# Patient Record
Sex: Female | Born: 1980 | Race: White | Hispanic: No | Marital: Married | Smoking: Never smoker
Health system: Southern US, Community
[De-identification: ages and names within clinical notes are randomized; demographics above are authoritative.]

## PROBLEM LIST (undated history)

## (undated) DIAGNOSIS — K219 Gastro-esophageal reflux disease without esophagitis: Secondary | ICD-10-CM

## (undated) DIAGNOSIS — Z8619 Personal history of other infectious and parasitic diseases: Secondary | ICD-10-CM

## (undated) DIAGNOSIS — K589 Irritable bowel syndrome without diarrhea: Secondary | ICD-10-CM

## (undated) HISTORY — DX: Personal history of other infectious and parasitic diseases: Z86.19

## (undated) HISTORY — DX: Irritable bowel syndrome, unspecified: K58.9

## (undated) HISTORY — DX: Gastro-esophageal reflux disease without esophagitis: K21.9

---
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
## 2002-01-02 ENCOUNTER — Encounter: Payer: Self-pay | Admitting: Internal Medicine

## 2002-01-02 ENCOUNTER — Encounter: Admission: RE | Admit: 2002-01-02 | Discharge: 2002-01-02 | Payer: Self-pay | Admitting: Internal Medicine

## 2002-07-11 ENCOUNTER — Encounter: Payer: Self-pay | Admitting: Internal Medicine

## 2002-07-11 ENCOUNTER — Encounter: Admission: RE | Admit: 2002-07-11 | Discharge: 2002-07-11 | Payer: Self-pay | Admitting: Internal Medicine

## 2002-10-01 ENCOUNTER — Other Ambulatory Visit: Admission: RE | Admit: 2002-10-01 | Discharge: 2002-10-01 | Payer: Self-pay | Admitting: Internal Medicine

## 2003-01-13 ENCOUNTER — Encounter: Admission: RE | Admit: 2003-01-13 | Discharge: 2003-01-13 | Payer: Self-pay | Admitting: Internal Medicine

## 2003-10-27 ENCOUNTER — Other Ambulatory Visit: Admission: RE | Admit: 2003-10-27 | Discharge: 2003-10-27 | Payer: Self-pay | Admitting: Family Medicine

## 2004-07-15 ENCOUNTER — Encounter: Admission: RE | Admit: 2004-07-15 | Discharge: 2004-07-15 | Payer: Self-pay | Admitting: Internal Medicine

## 2004-11-11 ENCOUNTER — Other Ambulatory Visit: Admission: RE | Admit: 2004-11-11 | Discharge: 2004-11-11 | Payer: Self-pay | Admitting: Internal Medicine

## 2005-06-21 ENCOUNTER — Other Ambulatory Visit: Admission: RE | Admit: 2005-06-21 | Discharge: 2005-06-21 | Payer: Self-pay | Admitting: Obstetrics and Gynecology

## 2006-01-03 ENCOUNTER — Other Ambulatory Visit: Admission: RE | Admit: 2006-01-03 | Discharge: 2006-01-03 | Payer: Self-pay | Admitting: Obstetrics and Gynecology

## 2007-01-04 ENCOUNTER — Other Ambulatory Visit: Admission: RE | Admit: 2007-01-04 | Discharge: 2007-01-04 | Payer: Self-pay | Admitting: Family Medicine

## 2007-07-04 ENCOUNTER — Other Ambulatory Visit: Admission: RE | Admit: 2007-07-04 | Discharge: 2007-07-04 | Payer: Self-pay | Admitting: Family Medicine

## 2008-01-08 ENCOUNTER — Other Ambulatory Visit: Admission: RE | Admit: 2008-01-08 | Discharge: 2008-01-08 | Payer: Self-pay | Admitting: Family Medicine

## 2008-05-23 ENCOUNTER — Encounter: Admission: RE | Admit: 2008-05-23 | Discharge: 2008-05-23 | Payer: Self-pay | Admitting: Family Medicine

## 2009-01-07 ENCOUNTER — Other Ambulatory Visit: Admission: RE | Admit: 2009-01-07 | Discharge: 2009-01-07 | Payer: Self-pay | Admitting: Family Medicine

## 2009-05-26 ENCOUNTER — Encounter: Admission: RE | Admit: 2009-05-26 | Discharge: 2009-05-26 | Payer: Self-pay | Admitting: Orthopedic Surgery

## 2009-10-29 ENCOUNTER — Encounter: Admission: RE | Admit: 2009-10-29 | Discharge: 2009-10-29 | Payer: Self-pay | Admitting: Family Medicine

## 2009-10-30 ENCOUNTER — Encounter: Admission: RE | Admit: 2009-10-30 | Discharge: 2009-10-30 | Payer: Self-pay | Admitting: Family Medicine

## 2010-01-12 ENCOUNTER — Other Ambulatory Visit: Admission: RE | Admit: 2010-01-12 | Discharge: 2010-01-12 | Payer: Self-pay | Admitting: Family Medicine

## 2010-07-16 NOTE — Op Note (Signed)
   NAME:  Taylor Payne, BRINKMAN                       ACCOUNT NO.:  [id number]   MEDICAL RECORD NO.:  [id number]                   PATIENT TYPE:  AMB   LOCATION:  ENDO                                 FACILITY:  [facility]   PHYSICIAN:  Danise Edge, M.D.                DATE OF BIRTH:  11/11/1980   DATE OF PROCEDURE:  12/14/2001  DATE OF DISCHARGE:  12/14/2001                                 OPERATIVE REPORT   PROCEDURE:  Flexible proctosigmoidoscopy.   INDICATIONS:  The patient is a 30 year old female born 1980/09/05.  The patient  has meal-stimulated abdominal pain, bloating, and nonbloody diarrhea  consistent with irritable bowel syndrome.  I have given her samples of Nu-  Lev 0.125 mg sublingual tablets to be used p.r.n.   DESCRIPTION OF PROCEDURE:  The patient was given a Fleet's enema prep.  She  did not require conscious sedation.  Anal inspection was normal.  Digital  rectal exam was normal.  Flexible proctocolonoscopy was carried out to 45 cm  using the Olympus pediatric colonoscope.  Endoscopic appearance of the  rectum and colon with the pediatric colonoscope advanced to 45 cm from the  anal verge was normal.  Endoscopically there was no evidence for the  presence of inflammatory bowel disease or colonic pathology.                                               Danise Edge, M.D.    MJ/MEDQ  D:  12/14/2001  T:  12/16/2001  Job:  [id number]   cc:   Marcene Duos, M.D.  [address]  [place]  [place] [address]  Fax: [phone]

## 2011-01-14 ENCOUNTER — Other Ambulatory Visit: Payer: Self-pay | Admitting: Physician Assistant

## 2011-01-14 ENCOUNTER — Other Ambulatory Visit (HOSPITAL_COMMUNITY)
Admission: RE | Admit: 2011-01-14 | Discharge: 2011-01-14 | Disposition: A | Discharge status: Home / Self Care | Payer: Self-pay | Source: Ambulatory Visit | Attending: Family Medicine | Admitting: Family Medicine

## 2011-01-14 DIAGNOSIS — Z124 Encounter for screening for malignant neoplasm of cervix: Secondary | ICD-10-CM | POA: Insufficient documentation

## 2011-11-12 ENCOUNTER — Emergency Department (INDEPENDENT_AMBULATORY_CARE_PROVIDER_SITE_OTHER)
Admission: EM | Admit: 2011-11-12 | Discharge: 2011-11-12 | Disposition: A | Discharge status: Nursing Home (Includes ICF) | Payer: PRIVATE HEALTH INSURANCE | Source: Home / Self Care | Attending: Family Medicine | Admitting: Family Medicine

## 2011-11-12 ENCOUNTER — Encounter (HOSPITAL_COMMUNITY): Payer: Self-pay | Admitting: *Deleted

## 2011-11-12 ENCOUNTER — Emergency Department (HOSPITAL_COMMUNITY)
Admission: EM | Admit: 2011-11-12 | Discharge: 2011-11-12 | Disposition: A | Discharge status: Home / Self Care | Payer: PRIVATE HEALTH INSURANCE | Attending: Emergency Medicine | Admitting: Emergency Medicine

## 2011-11-12 DIAGNOSIS — R109 Unspecified abdominal pain: Secondary | ICD-10-CM

## 2011-11-12 DIAGNOSIS — Z79899 Other long term (current) drug therapy: Secondary | ICD-10-CM | POA: Insufficient documentation

## 2011-11-12 LAB — CBC
HCT: 42.6 % (ref 36.0–46.0)
Hemoglobin: 14.4 g/dL (ref 12.0–15.0)
MCH: 26.5 pg (ref 26.0–34.0)
MCHC: 33.8 g/dL (ref 30.0–36.0)
MCV: 78.5 fL (ref 78.0–100.0)
Platelets: 305 10*3/uL (ref 150–400)
RBC: 5.43 MIL/uL — ABNORMAL HIGH (ref 3.87–5.11)
RDW: 13.6 % (ref 11.5–15.5)
WBC: 10.1 10*3/uL (ref 4.0–10.5)

## 2011-11-12 LAB — POCT I-STAT, CHEM 8
BUN: 8 mg/dL (ref 6–23)
Calcium, Ion: 1.16 mmol/L (ref 1.12–1.23)
Chloride: 105 mEq/L (ref 96–112)
Creatinine, Ser: 0.8 mg/dL (ref 0.50–1.10)
Glucose, Bld: 90 mg/dL (ref 70–99)
HCT: 45 % (ref 36.0–46.0)
Hemoglobin: 15.3 g/dL — ABNORMAL HIGH (ref 12.0–15.0)
Potassium: 3.9 mEq/L (ref 3.5–5.1)
Sodium: 140 mEq/L (ref 135–145)
TCO2: 23 mmol/L (ref 0–100)

## 2011-11-12 LAB — POCT URINALYSIS DIP (DEVICE)
Hgb urine dipstick: NEGATIVE
Protein, ur: NEGATIVE mg/dL
pH: 6.5 (ref 5.0–8.0)

## 2011-11-12 LAB — POCT PREGNANCY, URINE: Preg Test, Ur: NEGATIVE

## 2011-11-12 MED ORDER — NAPROXEN 500 MG PO TABS: 500.0000 mg | ORAL_TABLET | Freq: Two times a day (BID) | ORAL | Status: AC

## 2011-11-12 MED ORDER — TAMSULOSIN HCL 0.4 MG PO CAPS: 0.4000 mg | ORAL_CAPSULE | Freq: Two times a day (BID) | ORAL | Status: DC

## 2011-11-12 MED ORDER — HYDROCODONE-ACETAMINOPHEN 5-500 MG PO TABS
1.0000 | ORAL_TABLET | Freq: Four times a day (QID) | ORAL | Status: AC | PRN
Start: 2011-11-12 — End: 2011-11-22

## 2011-11-12 MED ORDER — PROMETHAZINE HCL 25 MG PO TABS: 25.0000 mg | ORAL_TABLET | Freq: Four times a day (QID) | ORAL | Status: DC | PRN

## 2011-11-12 NOTE — ED Notes (Signed)
Pt  Reports  Symptoms  Of r  Sided  Low  Back  Pain radiating to   r lower  abd    X  2  Weeks  Foul  Smelling  And  Frequent  Urine         She  Walks  Upright with a  Steady  Fluid  Gait  denys  Any  Vaginal bleed or discharge

## 2011-11-12 NOTE — ED Provider Notes (Signed)
History     CSN: [id number]  Arrival date & time 11/12/11  2021   First MD Initiated Contact with Patient 11/12/11 2321      Chief Complaint  Patient presents with  . Abdominal Pain    (Consider location/radiation/quality/duration/timing/severity/associated sxs/prior treatment) HPI Comments: 31 year old female with a history of right flank pain that has been ongoing for approximately 2 weeks, waxes and wanes, it is often pain-free has intermittent pain that comes on spontaneously. She denies associated hematuria, dysuria, nausea vomiting diaphoresis or abdominal pain. Nothing seems to make this better, has had no medications prior to arrival though she did this at the urgent care who transferred her here for further evaluation. The patient states that she does not want any evaluation in the emergency department, she feels that she can handle her symptoms at home and wants outpatient referral.  Patient is a 31 y.o. female presenting with abdominal pain. The history is provided by the patient and medical records.  Abdominal Pain The primary symptoms of the illness include abdominal pain.    History reviewed. No pertinent past medical history.  History reviewed. No pertinent past surgical history.  History reviewed. No pertinent family history.  History  Substance Use Topics  . Smoking status: Never Smoker   . Smokeless tobacco: Not on file  . Alcohol Use: Yes    OB History    Grav Para Term Preterm Abortions TAB SAB Ect Mult Living                  Review of Systems  Gastrointestinal: Positive for abdominal pain.  All other systems reviewed and are negative.    Allergies  Penicillins and Ocuflox  Home Medications   Current Outpatient Rx  Name Route Sig Dispense Refill  . DEXLANSOPRAZOLE 60 MG PO CPDR Oral Take 60 mg by mouth every evening.    Marland Kitchen LEVOCETIRIZINE DIHYDROCHLORIDE 5 MG PO TABS Oral Take 2.5 mg by mouth every evening.    Marland Kitchen XYZAL PO Oral Take 0.5 tablets  by mouth every evening.    . SKELAXIN PO Oral Take 0.5 tablets by mouth daily as needed. For muscle spasms    . MOMETASONE FUROATE 50 MCG/ACT NA SUSP Nasal Place 2 sprays into the nose daily.    Marland Kitchen RANITIDINE HCL 300 MG PO CAPS Oral Take 300 mg by mouth every evening.    Marland Kitchen HYDROCODONE-ACETAMINOPHEN 5-500 MG PO TABS Oral Take 1-2 tablets by mouth every 6 (six) hours as needed for pain. 15 tablet 0  . NAPROXEN 500 MG PO TABS Oral Take 1 tablet (500 mg total) by mouth 2 (two) times daily with a meal. 30 tablet 0  . NORETHIN ACE-ETH ESTRAD-FE 1-20 MG-MCG PO TABS Oral Take 1 tablet by mouth daily.    Marland Kitchen PROMETHAZINE HCL 25 MG PO TABS Oral Take 1 tablet (25 mg total) by mouth every 6 (six) hours as needed for nausea. 12 tablet 0  . TAMSULOSIN HCL 0.4 MG PO CAPS Oral Take 1 capsule (0.4 mg total) by mouth 2 (two) times daily. 10 capsule 0    BP 121/90  Pulse 86  Temp 98.4 F (36.9 C) (Oral)  Resp 16  SpO2 100%  LMP 10/12/2011  Physical Exam  Nursing note and vitals reviewed. Constitutional: She appears well-developed and well-nourished. No distress.  HENT:  Head: Normocephalic and atraumatic.  Eyes: Conjunctivae normal are normal. Right eye exhibits no discharge. Left eye exhibits no discharge. No scleral icterus.  Cardiovascular: Normal rate, regular  rhythm, normal heart sounds and intact distal pulses.  Exam reveals no gallop and no friction rub.   No murmur heard. Pulmonary/Chest: Effort normal and breath sounds normal. No respiratory distress. She has no wheezes. She has no rales.  Abdominal: Soft. Bowel sounds are normal. She exhibits no distension and no mass. There is no tenderness.       No CVA tenderness  Musculoskeletal: Normal range of motion. She exhibits no edema and no tenderness.  Neurological: She is alert. Coordination normal.  Skin: Skin is warm and dry. No rash noted. No erythema.  Psychiatric: She has a normal mood and affect. Her behavior is normal.    ED Course    Procedures (including critical care time)  Labs Reviewed  CBC - Abnormal; Notable for the following:    RBC 5.43 (*)     All other components within normal limits  POCT I-STAT, CHEM 8 - Abnormal; Notable for the following:    Hemoglobin 15.3 (*)     All other components within normal limits   No results found.   1. Right flank pain       MDM  Well appearing, vital signs are normal, patient does not want any further evaluation here but accepts medical treatment of a possible kidney stone, will discharge home with urology contact information, Phenergan, Vicodin, Naprosyn, Flomax. He has expressed her understanding for the indications for return.        Vida Roller, MD 11/12/11 [phone]

## 2011-11-12 NOTE — ED Notes (Signed)
Patient with c/o right lower flank abdominal pain for about two weeks.  Patient thinks that it is kidney stone.  Patient sent from [facility] for further evaluation.

## 2011-11-12 NOTE — ED Provider Notes (Signed)
History     CSN: [id number]  Arrival date & time 11/12/11  1848   None     Chief Complaint  Patient presents with  . Urinary Tract Infection    (Consider location/radiation/quality/duration/timing/severity/associated sxs/prior treatment) Patient is a 31 y.o. female presenting with urinary tract infection. The history is provided by the patient.  Urinary Tract Infection Associated symptoms include abdominal pain.  Patient reports intermittent right flank pain that radiating down right leg for one week, states in the past couple of days unable to sleep related to pain and has began to feel pain in groin along with abdominal pressure. Noted urinary hesistency and retention with urinary frequency.  Reports flank pain is aggravated with positional, no noted fever, decreased appetite, + fatigue.  Denies hx of same, no urinary or musculoskeletal disorders.  No abnormal vaginal discharge or bleeding.  Denies known exposure to STD.   History reviewed. No pertinent past medical history.  History reviewed. No pertinent past surgical history.  No family history on file.  History  Substance Use Topics  . Smoking status: Never Smoker   . Smokeless tobacco: Not on file  . Alcohol Use: Yes    OB History    Grav Para Term Preterm Abortions TAB SAB Ect Mult Living                  Review of Systems  Constitutional: Positive for appetite change and fatigue. Negative for fever, chills and diaphoresis.  Respiratory: Negative.   Cardiovascular: Negative.   Gastrointestinal: Positive for abdominal pain. Negative for nausea, vomiting and diarrhea.  Genitourinary: Positive for urgency, frequency, flank pain and pelvic pain. Negative for dysuria, hematuria, decreased urine volume, vaginal bleeding, vaginal discharge, difficulty urinating and menstrual problem.  Skin: Negative.     Allergies  Acuflex and Penicillins  Home Medications  No current outpatient prescriptions on file.  BP 116/79   Pulse 82  Temp 99.3 F (37.4 C) (Oral)  Resp 18  SpO2 97%  LMP 10/12/2011  Physical Exam  Nursing note and vitals reviewed. Constitutional: She is oriented to person, place, and time. Vital signs are normal. She appears well-developed and well-nourished. She is active and cooperative.  HENT:  Head: Normocephalic.  Mouth/Throat: Oropharynx is clear and moist. No oropharyngeal exudate.  Eyes: Conjunctivae normal are normal. Pupils are equal, round, and reactive to light. No scleral icterus.  Neck: Trachea normal. Neck supple.  Cardiovascular: Normal rate, regular rhythm and normal heart sounds.   Pulmonary/Chest: Effort normal and breath sounds normal. She exhibits no tenderness.  Abdominal: Soft. Bowel sounds are normal. There is tenderness in the right lower quadrant and suprapubic area. There is CVA tenderness.  Musculoskeletal:       Back:       Right cva tenderness  Lymphadenopathy:    She has no cervical adenopathy.       Right: No inguinal adenopathy present.       Left: No inguinal adenopathy present.  Neurological: She is alert and oriented to person, place, and time. She has normal strength. No cranial nerve deficit or sensory deficit. GCS eye subscore is 4. GCS verbal subscore is 5. GCS motor subscore is 6.  Skin: Skin is warm and dry.  Psychiatric: She has a normal mood and affect. Her speech is normal and behavior is normal. Judgment and thought content normal. Cognition and memory are normal.    ED Course  Procedures (including critical care time)  Labs Reviewed  POCT URINALYSIS DIP (  DEVICE) - Abnormal; Notable for the following:    Ketones, ur TRACE (*)     All other components within normal limits  POCT PREGNANCY, URINE   No results found.   1. Right flank pain       MDM  Transfer to [facility] for further evaluation of flank pain and treatment.       Johnsie Kindred, NP 11/12/11 2028

## 2011-11-13 NOTE — ED Provider Notes (Signed)
Medical screening examination/treatment/procedure(s) were performed by resident physician or non-physician practitioner and as supervising physician I was immediately available for consultation/collaboration.   Janisa Labus DOUGLAS MD.    Len Azeez D Jeniyah Menor, MD 11/13/11 0921 

## 2011-12-01 ENCOUNTER — Other Ambulatory Visit: Payer: Self-pay | Admitting: Dermatology

## 2012-01-19 ENCOUNTER — Other Ambulatory Visit: Payer: Self-pay | Admitting: Physician Assistant

## 2012-01-19 ENCOUNTER — Other Ambulatory Visit (HOSPITAL_COMMUNITY)
Admission: RE | Admit: 2012-01-19 | Discharge: 2012-01-19 | Disposition: A | Discharge status: Home / Self Care | Payer: PRIVATE HEALTH INSURANCE | Source: Ambulatory Visit | Attending: Family Medicine | Admitting: Family Medicine

## 2012-01-19 DIAGNOSIS — Z124 Encounter for screening for malignant neoplasm of cervix: Secondary | ICD-10-CM | POA: Insufficient documentation

## 2012-12-03 ENCOUNTER — Other Ambulatory Visit: Payer: Self-pay | Admitting: Dermatology

## 2013-01-21 ENCOUNTER — Other Ambulatory Visit (HOSPITAL_COMMUNITY)
Admission: RE | Admit: 2013-01-21 | Discharge: 2013-01-21 | Disposition: A | Discharge status: Home / Self Care | Payer: PRIVATE HEALTH INSURANCE | Source: Ambulatory Visit | Attending: Family Medicine | Admitting: Family Medicine

## 2013-01-21 ENCOUNTER — Other Ambulatory Visit: Payer: Self-pay | Admitting: Physician Assistant

## 2013-01-21 DIAGNOSIS — N6310 Unspecified lump in the right breast, unspecified quadrant: Secondary | ICD-10-CM

## 2013-01-21 DIAGNOSIS — Z124 Encounter for screening for malignant neoplasm of cervix: Secondary | ICD-10-CM | POA: Insufficient documentation

## 2013-01-28 ENCOUNTER — Ambulatory Visit
Admission: RE | Admit: 2013-01-28 | Discharge: 2013-01-28 | Disposition: A | Discharge status: Home / Self Care | Payer: PRIVATE HEALTH INSURANCE | Source: Ambulatory Visit | Attending: Physician Assistant | Admitting: Physician Assistant

## 2013-01-28 DIAGNOSIS — N6310 Unspecified lump in the right breast, unspecified quadrant: Secondary | ICD-10-CM

## 2014-02-14 LAB — OB RESULTS CONSOLE RPR: RPR: NONREACTIVE

## 2014-02-14 LAB — OB RESULTS CONSOLE ABO/RH: RH Type: POSITIVE

## 2014-02-14 LAB — OB RESULTS CONSOLE RUBELLA ANTIBODY, IGM: RUBELLA: IMMUNE

## 2014-02-14 LAB — OB RESULTS CONSOLE HEPATITIS B SURFACE ANTIGEN: Hepatitis B Surface Ag: NEGATIVE

## 2014-02-14 LAB — OB RESULTS CONSOLE ANTIBODY SCREEN: ANTIBODY SCREEN: NEGATIVE

## 2014-02-14 LAB — OB RESULTS CONSOLE GC/CHLAMYDIA
Chlamydia: NEGATIVE
Gonorrhea: NEGATIVE

## 2014-02-14 LAB — OB RESULTS CONSOLE HIV ANTIBODY (ROUTINE TESTING): HIV: NONREACTIVE

## 2014-02-28 NOTE — L&D Delivery Note (Signed)
Delivery Note At 4:58 PM a viable female was delivered via Vaginal, Spontaneous Delivery (Presentation: Left Occiput Anterior).  APGAR: 6, 9; weight pending.   Placenta status: Intact, Spontaneous.  Cord: 3 vessels with the following complications: None.  Anesthesia: Epidural  Episiotomy:  None Lacerations: 2nd degree Suture Repair: 3.0 vicryl rapide Est. Blood Loss (mL): 200  Mom to postpartum.  Baby to Couplet care / Skin to Skin.  Discussed circumcision procedure and risks, will proceed tomorrow.  Benancio Osmundson D 09/18/2014, 5:23 PM

## 2014-03-03 ENCOUNTER — Inpatient Hospital Stay (HOSPITAL_COMMUNITY)
Admission: AD | Admit: 2014-03-03 | Payer: PRIVATE HEALTH INSURANCE | Source: Ambulatory Visit | Admitting: Obstetrics and Gynecology

## 2014-08-13 LAB — OB RESULTS CONSOLE GBS: GBS: NEGATIVE

## 2014-09-11 ENCOUNTER — Telehealth (HOSPITAL_COMMUNITY): Payer: Self-pay | Admitting: *Deleted

## 2014-09-11 ENCOUNTER — Encounter (HOSPITAL_COMMUNITY): Payer: Self-pay | Admitting: *Deleted

## 2014-09-11 NOTE — Telephone Encounter (Signed)
Preadmission screen  

## 2014-09-14 ENCOUNTER — Inpatient Hospital Stay (HOSPITAL_COMMUNITY)
Admission: AD | Admit: 2014-09-14 | Payer: PRIVATE HEALTH INSURANCE | Source: Ambulatory Visit | Admitting: Obstetrics and Gynecology

## 2014-09-18 ENCOUNTER — Inpatient Hospital Stay (HOSPITAL_COMMUNITY)
Admission: RE | Admit: 2014-09-18 | Discharge: 2014-09-20 | DRG: 775 | Disposition: A | Discharge status: Home / Self Care | Payer: Managed Care, Other (non HMO) | Source: Ambulatory Visit | Attending: Obstetrics and Gynecology | Admitting: Obstetrics and Gynecology

## 2014-09-18 ENCOUNTER — Inpatient Hospital Stay (HOSPITAL_COMMUNITY): Payer: Managed Care, Other (non HMO) | Admitting: Anesthesiology

## 2014-09-18 ENCOUNTER — Encounter (HOSPITAL_COMMUNITY): Payer: Self-pay

## 2014-09-18 DIAGNOSIS — Z3400 Encounter for supervision of normal first pregnancy, unspecified trimester: Secondary | ICD-10-CM

## 2014-09-18 DIAGNOSIS — K219 Gastro-esophageal reflux disease without esophagitis: Secondary | ICD-10-CM | POA: Diagnosis present

## 2014-09-18 DIAGNOSIS — Z3A40 40 weeks gestation of pregnancy: Secondary | ICD-10-CM | POA: Diagnosis present

## 2014-09-18 DIAGNOSIS — K589 Irritable bowel syndrome without diarrhea: Secondary | ICD-10-CM | POA: Diagnosis present

## 2014-09-18 DIAGNOSIS — O9962 Diseases of the digestive system complicating childbirth: Secondary | ICD-10-CM | POA: Diagnosis present

## 2014-09-18 LAB — CBC
HCT: 36.5 % (ref 36.0–46.0)
Hemoglobin: 12 g/dL (ref 12.0–15.0)
MCH: 27.1 pg (ref 26.0–34.0)
MCHC: 32.9 g/dL (ref 30.0–36.0)
MCV: 82.6 fL (ref 78.0–100.0)
Platelets: 143 10*3/uL — ABNORMAL LOW (ref 150–400)
RBC: 4.42 MIL/uL (ref 3.87–5.11)
RDW: 15.6 % — ABNORMAL HIGH (ref 11.5–15.5)
WBC: 10.6 10*3/uL — ABNORMAL HIGH (ref 4.0–10.5)

## 2014-09-18 LAB — RPR: RPR Ser Ql: NONREACTIVE

## 2014-09-18 MED ORDER — IBUPROFEN 600 MG PO TABS
600.0000 mg | ORAL_TABLET | Freq: Four times a day (QID) | ORAL | Status: DC
  Administered 2014-09-18 – 2014-09-20 (×6): 600 mg via ORAL
  Filled 2014-09-18 (×6): qty 1

## 2014-09-18 MED ORDER — LEVOCETIRIZINE DIHYDROCHLORIDE 5 MG PO TABS: 2.5000 mg | ORAL_TABLET | Freq: Every evening | ORAL | Status: DC

## 2014-09-18 MED ORDER — ACETAMINOPHEN 325 MG PO TABS: 650.0000 mg | ORAL_TABLET | ORAL | Status: DC | PRN

## 2014-09-18 MED ORDER — SENNOSIDES-DOCUSATE SODIUM 8.6-50 MG PO TABS
2.0000 | ORAL_TABLET | ORAL | Status: DC
  Administered 2014-09-19 (×2): 2 via ORAL
  Filled 2014-09-18 (×2): qty 2

## 2014-09-18 MED ORDER — LORATADINE 10 MG PO TABS
10.0000 mg | ORAL_TABLET | Freq: Every evening | ORAL | Status: DC
  Administered 2014-09-18 – 2014-09-19 (×2): 10 mg via ORAL
  Filled 2014-09-18 (×2): qty 1

## 2014-09-18 MED ORDER — CITRIC ACID-SODIUM CITRATE 334-500 MG/5ML PO SOLN: 30.0000 mL | ORAL | Status: DC | PRN

## 2014-09-18 MED ORDER — FENTANYL 2.5 MCG/ML BUPIVACAINE 1/10 % EPIDURAL INFUSION (WH - ANES)
14.0000 mL/h | INTRAMUSCULAR | Status: DC | PRN
  Administered 2014-09-18 (×2): 14 mL/h via EPIDURAL
  Filled 2014-09-18: qty 125

## 2014-09-18 MED ORDER — SIMETHICONE 80 MG PO CHEW: 80.0000 mg | CHEWABLE_TABLET | ORAL | Status: DC | PRN

## 2014-09-18 MED ORDER — METHYLERGONOVINE MALEATE 0.2 MG/ML IJ SOLN: 0.2000 mg | INTRAMUSCULAR | Status: DC | PRN

## 2014-09-18 MED ORDER — OXYCODONE-ACETAMINOPHEN 5-325 MG PO TABS: 2.0000 | ORAL_TABLET | ORAL | Status: DC | PRN

## 2014-09-18 MED ORDER — PRENATAL MULTIVITAMIN CH
1.0000 | ORAL_TABLET | Freq: Every day | ORAL | Status: DC
  Administered 2014-09-19: 1 via ORAL
  Filled 2014-09-18: qty 1

## 2014-09-18 MED ORDER — ONDANSETRON HCL 4 MG/2ML IJ SOLN
4.0000 mg | INTRAMUSCULAR | Status: DC | PRN
Start: 2014-09-18 — End: 2014-09-20

## 2014-09-18 MED ORDER — LACTATED RINGERS IV SOLN
500.0000 mL | INTRAVENOUS | Status: DC | PRN
Start: 2014-09-18 — End: 2014-09-18
  Administered 2014-09-18: 500 mL via INTRAVENOUS

## 2014-09-18 MED ORDER — OXYCODONE-ACETAMINOPHEN 5-325 MG PO TABS: 1.0000 | ORAL_TABLET | ORAL | Status: DC | PRN

## 2014-09-18 MED ORDER — OXYTOCIN 40 UNITS IN LACTATED RINGERS INFUSION - SIMPLE MED: 62.5000 mL/h | INTRAVENOUS | Status: DC

## 2014-09-18 MED ORDER — BENZOCAINE-MENTHOL 20-0.5 % EX AERO
1.0000 "application " | INHALATION_SPRAY | CUTANEOUS | Status: DC | PRN
  Administered 2014-09-18 – 2014-09-20 (×3): 1 via TOPICAL
  Filled 2014-09-18 (×3): qty 56

## 2014-09-18 MED ORDER — OXYTOCIN 40 UNITS IN LACTATED RINGERS INFUSION - SIMPLE MED
1.0000 m[IU]/min | INTRAVENOUS | Status: DC
  Administered 2014-09-18: 4 m[IU]/min via INTRAVENOUS
  Administered 2014-09-18: 2 m[IU]/min via INTRAVENOUS
  Filled 2014-09-18: qty 1000

## 2014-09-18 MED ORDER — TERBUTALINE SULFATE 1 MG/ML IJ SOLN
0.2500 mg | Freq: Once | INTRAMUSCULAR | Status: DC | PRN
  Filled 2014-09-18: qty 1

## 2014-09-18 MED ORDER — DIPHENHYDRAMINE HCL 25 MG PO CAPS: 25.0000 mg | ORAL_CAPSULE | Freq: Four times a day (QID) | ORAL | Status: DC | PRN

## 2014-09-18 MED ORDER — ONDANSETRON HCL 4 MG PO TABS: 4.0000 mg | ORAL_TABLET | ORAL | Status: DC | PRN

## 2014-09-18 MED ORDER — DIBUCAINE 1 % RE OINT: 1.0000 "application " | TOPICAL_OINTMENT | RECTAL | Status: DC | PRN

## 2014-09-18 MED ORDER — ZOLPIDEM TARTRATE 5 MG PO TABS: 5.0000 mg | ORAL_TABLET | Freq: Every evening | ORAL | Status: DC | PRN

## 2014-09-18 MED ORDER — OXYTOCIN BOLUS FROM INFUSION: 500.0000 mL | INTRAVENOUS | Status: DC

## 2014-09-18 MED ORDER — MEASLES, MUMPS & RUBELLA VAC ~~LOC~~ INJ: 0.5000 mL | INJECTION | Freq: Once | SUBCUTANEOUS | Status: DC

## 2014-09-18 MED ORDER — MAGNESIUM HYDROXIDE 400 MG/5ML PO SUSP: 30.0000 mL | ORAL | Status: DC | PRN

## 2014-09-18 MED ORDER — LANOLIN HYDROUS EX OINT: TOPICAL_OINTMENT | CUTANEOUS | Status: DC | PRN

## 2014-09-18 MED ORDER — TETANUS-DIPHTH-ACELL PERTUSSIS 5-2.5-18.5 LF-MCG/0.5 IM SUSP: 0.5000 mL | Freq: Once | INTRAMUSCULAR | Status: DC

## 2014-09-18 MED ORDER — LIDOCAINE HCL (PF) 1 % IJ SOLN
30.0000 mL | INTRAMUSCULAR | Status: DC | PRN
  Administered 2014-09-18: 30 mL via SUBCUTANEOUS
  Filled 2014-09-18: qty 30

## 2014-09-18 MED ORDER — DIPHENHYDRAMINE HCL 50 MG/ML IJ SOLN: 12.5000 mg | INTRAMUSCULAR | Status: DC | PRN

## 2014-09-18 MED ORDER — BUTORPHANOL TARTRATE 1 MG/ML IJ SOLN
1.0000 mg | INTRAMUSCULAR | Status: DC | PRN
  Administered 2014-09-18: 1 mg via INTRAVENOUS
  Filled 2014-09-18: qty 1

## 2014-09-18 MED ORDER — LACTATED RINGERS IV SOLN
INTRAVENOUS | Status: DC
  Administered 2014-09-18: 1000 mL via INTRAVENOUS

## 2014-09-18 MED ORDER — PHENYLEPHRINE 40 MCG/ML (10ML) SYRINGE FOR IV PUSH (FOR BLOOD PRESSURE SUPPORT)
80.0000 ug | PREFILLED_SYRINGE | INTRAVENOUS | Status: DC | PRN
  Filled 2014-09-18: qty 20
  Filled 2014-09-18: qty 2

## 2014-09-18 MED ORDER — WITCH HAZEL-GLYCERIN EX PADS: 1.0000 "application " | MEDICATED_PAD | CUTANEOUS | Status: DC | PRN

## 2014-09-18 MED ORDER — METHYLERGONOVINE MALEATE 0.2 MG PO TABS: 0.2000 mg | ORAL_TABLET | ORAL | Status: DC | PRN

## 2014-09-18 MED ORDER — FLUTICASONE PROPIONATE 50 MCG/ACT NA SUSP
1.0000 | Freq: Every day | NASAL | Status: DC
  Administered 2014-09-19 – 2014-09-20 (×2): 1 via NASAL
  Filled 2014-09-18: qty 16

## 2014-09-18 MED ORDER — LIDOCAINE HCL (PF) 1 % IJ SOLN
INTRAMUSCULAR | Status: DC | PRN
  Administered 2014-09-18: 4 mL
  Administered 2014-09-18: 2 mL
  Administered 2014-09-18: 4 mL

## 2014-09-18 MED ORDER — ONDANSETRON HCL 4 MG/2ML IJ SOLN
4.0000 mg | Freq: Four times a day (QID) | INTRAMUSCULAR | Status: DC | PRN
  Administered 2014-09-18: 4 mg via INTRAVENOUS
  Filled 2014-09-18: qty 2

## 2014-09-18 MED ORDER — EPHEDRINE 5 MG/ML INJ
10.0000 mg | INTRAVENOUS | Status: DC | PRN
  Filled 2014-09-18: qty 2

## 2014-09-18 NOTE — Anesthesia Procedure Notes (Signed)
Epidural Patient location during procedure: OB  Staffing Anesthesiologist: Joliene Salvador Performed by: anesthesiologist   Preanesthetic Checklist Completed: patient identified, site marked, surgical consent, pre-op evaluation, timeout performed, IV checked, risks and benefits discussed and monitors and equipment checked  Epidural Patient position: sitting Prep: site prepped and draped and DuraPrep Patient monitoring: continuous pulse ox and blood pressure Approach: midline Location: L3-L4 Injection technique: LOR air  Needle:  Needle type: Tuohy  Needle gauge: 17 G Needle length: 9 cm and 9 Needle insertion depth: 4 cm Catheter type: closed end flexible Catheter size: 19 Gauge Catheter at skin depth: 9 cm Test dose: negative  Assessment Events: blood not aspirated, injection not painful, no injection resistance, negative IV test and no paresthesia  Additional Notes Patient identified. Risks/Benefits/Options discussed with patient including but not limited to bleeding, infection, nerve damage, paralysis, failed block, incomplete pain control, headache, blood pressure changes, nausea, vomiting, reactions to medication both or allergic, itching and postpartum back pain. Confirmed with bedside nurse the patient's most recent platelet count. Confirmed with patient that they are not currently taking any anticoagulation, have any bleeding history or any family history of bleeding disorders. Patient expressed understanding and wished to proceed. All questions were answered. Sterile technique was used throughout the entire procedure. Please see nursing notes for vital signs. Test dose was given through epidural catheter and negative prior to continuing to dose epidural or start infusion. Warning signs of high block given to the patient including shortness of breath, tingling/numbness in hands, complete motor block, or any concerning symptoms with instructions to call for help. Patient was  given instructions on fall risk and not to get out of bed. All questions and concerns addressed with instructions to call with any issues or inadequate analgesia.

## 2014-09-18 NOTE — H&P (Signed)
Taylor Payne is a 34 y.o. female, G1P0, EGA 40+ weeks with EDC 7-17 presenting for induction.  Prenatal care uncomplicated.  Maternal Medical History:  Contractions: Frequency: irregular.    Fetal activity: Perceived fetal activity is normal.      OB History    Gravida Para Term Preterm AB TAB SAB Ectopic Multiple Living   1              Past Medical History  Diagnosis Date  . Hx of varicella   . IBS (irritable bowel syndrome)   . GERD (gastroesophageal reflux disease)    History reviewed. No pertinent past surgical history. Family History: family history is not on file. Social History:  reports that she has never smoked. She has never used smokeless tobacco. She reports that she drinks alcohol. She reports that she does not use illicit drugs.   Prenatal Transfer Tool  Maternal Diabetes: No Genetic Screening: Normal Maternal Ultrasounds/Referrals: Normal Fetal Ultrasounds or other Referrals:  None Maternal Substance Abuse:  No Significant Maternal Medications:  None Significant Maternal Lab Results:  Lab values include: Group B Strep negative Other Comments:  None  Review of Systems  Respiratory: Negative.   Cardiovascular: Negative.    AROM- clear Dilation: 2 Effacement (%): 70 Station: -2 Exam by:: Taylor Payne Blood pressure 120/89, last menstrual period 12/08/2013. Maternal Exam:  Uterine Assessment: Contraction strength is mild.  Contraction frequency is irregular.   Abdomen: Patient reports no abdominal tenderness. Estimated fetal weight is 7 1/2 lbs.   Fetal presentation: vertex  Introitus: Normal vulva. Normal vagina.  Amniotic fluid character: clear.  Pelvis: adequate for delivery.   Cervix: Cervix evaluated by digital exam.     Fetal Exam Fetal Monitor Review: Mode: ultrasound.   Baseline rate: 130.  Variability: moderate (6-25 bpm).   Pattern: accelerations present and no decelerations.    Fetal State Assessment: Category I - tracings are  normal.     Physical Exam  Vitals reviewed. Constitutional: She appears well-developed and well-nourished.  Cardiovascular: Normal rate, regular rhythm and normal heart sounds.   No murmur heard. Respiratory: Effort normal and breath sounds normal. No respiratory distress. She has no wheezes.  GI: Soft.    Prenatal labs: ABO, Rh: O/Positive/-- (12/18 0000) Antibody: Negative (12/18 0000) Rubella: Immune (12/18 0000) RPR: Nonreactive (12/18 0000)  HBsAg: Negative (12/18 0000)  HIV: Non-reactive (12/18 0000)  GBS: Negative (06/15 0000)   Assessment/Plan: IUP at 40+ weeks for induction.  On pitocin, AROM done, will monitor progress and anticipate SVD.   Taylor Payne D 09/18/2014, 8:16 AM

## 2014-09-18 NOTE — Anesthesia Preprocedure Evaluation (Addendum)
Anesthesia Evaluation  Patient identified by MRN, date of birth, ID band Patient awake    Reviewed: Allergy & Precautions, H&P , NPO status , Patient's Chart, lab work & pertinent test results, reviewed documented beta blocker date and time   Airway Mallampati: II  TM Distance: >3 FB Neck ROM: full    Dental no notable dental hx.    Pulmonary neg pulmonary ROS,  breath sounds clear to auscultation  Pulmonary exam normal       Cardiovascular negative cardio ROS Normal cardiovascular examRhythm:regular Rate:Normal     Neuro/Psych negative neurological ROS  negative psych ROS   GI/Hepatic Neg liver ROS, GERD-  ,  Endo/Other  negative endocrine ROS  Renal/GU negative Renal ROS  negative genitourinary   Musculoskeletal   Abdominal   Peds  Hematology negative hematology ROS (+)   Anesthesia Other Findings Pregnancy - uncomplicated Platelets and allergies reviewed Denies active cardiac or pulmonary symptoms, METS > 4  Denies blood thinning medications, bleeding disorders, hypertension, asthma, supine hypotension syndrome, previous anesthesia difficulties    Reproductive/Obstetrics (+) Pregnancy                             Anesthesia Physical Anesthesia Plan  ASA: II  Anesthesia Plan: Epidural   Post-op Pain Management:    Induction:   Airway Management Planned:   Additional Equipment:   Intra-op Plan:   Post-operative Plan:   Informed Consent: I have reviewed the patients History and Physical, chart, labs and discussed the procedure including the risks, benefits and alternatives for the proposed anesthesia with the patient or authorized representative who has indicated his/her understanding and acceptance.     Plan Discussed with:   Anesthesia Plan Comments:        Anesthesia Quick Evaluation

## 2014-09-18 NOTE — Progress Notes (Signed)
Feeling ctx Afeb, VSS FHT- Cat I VE-5/80/-1, vtx Continue pitocin, will get epidural

## 2014-09-19 NOTE — Anesthesia Postprocedure Evaluation (Signed)
  Anesthesia Post-op Note  Patient: Taylor Payne  Procedure(s) Performed: * No procedures listed *  Patient Location: Mother/Baby  Anesthesia Type:Epidural  Level of Consciousness: awake, alert , oriented and patient cooperative  Airway and Oxygen Therapy: Patient Spontanous Breathing  Post-op Pain: none  Post-op Assessment: Post-op Vital signs reviewed, Patient's Cardiovascular Status Stable, Respiratory Function Stable, Patent Airway, No headache, No backache and Patient able to bend at knees              Post-op Vital Signs: Reviewed and stable  Last Vitals:  Filed Vitals:   09/19/14 0037  BP: 127/68  Pulse: 86  Temp: 36.6 C  Resp: 20    Complications: No apparent anesthesia complications

## 2014-09-19 NOTE — Progress Notes (Signed)
PPD #1 No problems Afeb, VSS Fundus firm, NT at U-1 Continue routine postpartum care 

## 2014-09-19 NOTE — Lactation Note (Signed)
This note was copied from the chart of [facility] Lactation Consultation Note  Patient Name: Boy Javaria Knapke ZOXWR'U Date: 09/19/2014 Reason for consult: Initial assessment   Maternal Data Has patient been taught Hand Expression?: Yes Does the patient have breastfeeding experience prior to this delivery?: No  Feeding Feeding Type: Breast Milk Length of feed: 0 min  LATCH Score/Interventions Latch: Too sleepy or reluctant, no latch achieved, no sucking elicited. Intervention(s): Skin to skin;Teach feeding cues;Waking techniques Intervention(s): Adjust position;Assist with latch;Breast massage;Breast compression  Audible Swallowing: None Intervention(s): Skin to skin;Hand expression  Type of Nipple: Everted at rest and after stimulation  Comfort (Breast/Nipple): Soft / non-tender  Problem noted: Filling Interventions (Filling): Massage;Hand pump  Hold (Positioning): Assistance needed to correctly position infant at breast and maintain latch. Intervention(s): Skin to skin;Position options;Support Pillows;Breastfeeding basics reviewed  LATCH Score: 5  Lactation Tools Discussed/Used Tools: Pump;Shells Shell Type: Inverted Breast pump type: Manual Pump Review: Setup, frequency, and cleaning;Milk Storage Initiated by:: Peri Jefferson RN Date initiated:: 09/19/14   Consult Status Consult Status: Follow-up Date: 09/19/14 Follow-up type: In-patient    Charyl Dancer 09/19/2014, 5:39 AM

## 2014-09-19 NOTE — Lactation Note (Signed)
This note was copied from the chart of [facility]. Lactation Consultation Note Follow up visit at 24 hours of age.  Baby had circumcision at noon today.  Mom reports baby just finished a 15 minutes feeding and is alert and continues to show feeding cues.  MOm reports baby has not been doing well with football hold on right breast.  Assisted with pillow support and baby latched well.  Baby maintained strong rhythmic sucking for over 15 minutes and good swallows.  Answered questions and discussed feeding frequency and cluster feeding.    Patient Name: Taylor Payne ZOXWR'U Date: 09/19/2014 Reason for consult: Follow-up assessment   Maternal Data    Feeding Feeding Type: Breast Fed Length of feed:  (observed 15 minutes)  LATCH Score/Interventions Latch: Grasps breast easily, tongue down, lips flanged, rhythmical sucking.  Audible Swallowing: Spontaneous and intermittent  Type of Nipple: Everted at rest and after stimulation  Comfort (Breast/Nipple): Soft / non-tender     Hold (Positioning): Assistance needed to correctly position infant at breast and maintain latch. Intervention(s): Breastfeeding basics reviewed;Support Pillows;Position options;Skin to skin  LATCH Score: 9  Lactation Tools Discussed/Used     Consult Status Consult Status: Follow-up Date: 09/20/14 Follow-up type: In-patient    Shoptaw, Arvella Merles 09/19/2014, 5:51 PM

## 2014-09-19 NOTE — Lactation Note (Signed)
This note was copied from the chart of [facility]. Lactation Consultation Note Baby continues to be very spitty of medium emesis of water, frothy sputum, mixed a little colostrum that I tried to get into baby earlier. Got FOB to hold baby up right for a while until baby's tummy settles down. Noted mom's gown had 2 wet spots at nipple area. Got mom a hand pump with demonstration of pumping and cleaning. Hand expressed and pumped breast of thick colostrum 8ml. Encouraged to put baby STS on moms chest.  Gave mom shells to help evert nipples more for a deep latch. Discussed breast filling, and pumping since baby will not BF at this time. Stimulating breast important. Reported to RN and nursery of baby spitting and not BF. Patient Name: Taylor Payne HQION'G Date: 09/19/2014 Reason for consult: Follow-up assessment   Maternal Data Has patient been taught Hand Expression?: Yes Does the patient have breastfeeding experience prior to this delivery?: No  Feeding Feeding Type: Breast Milk Length of feed: 0 min  LATCH Score/Interventions Latch: Too sleepy or reluctant, no latch achieved, no sucking elicited. Intervention(s): Skin to skin;Teach feeding cues;Waking techniques Intervention(s): Adjust position;Assist with latch;Breast massage;Breast compression  Audible Swallowing: None Intervention(s): Skin to skin;Hand expression  Type of Nipple: Everted at rest and after stimulation  Comfort (Breast/Nipple): Soft / non-tender  Problem noted: Filling Interventions (Filling): Massage;Hand pump  Hold (Positioning): Assistance needed to correctly position infant at breast and maintain latch. Intervention(s): Skin to skin;Position options;Support Pillows;Breastfeeding basics reviewed  LATCH Score: 5  Lactation Tools Discussed/Used Tools: Shells;Pump Shell Type: Inverted Breast pump type: Manual Pump Review: Setup, frequency, and cleaning;Milk Storage Initiated by:: Peri Jefferson  RN Date initiated:: 09/19/14   Consult Status Consult Status: Follow-up Date: 09/19/14 Follow-up type: In-patient    Taylor Payne, Diamond Nickel 09/19/2014, 6:09 AM

## 2014-09-20 ENCOUNTER — Ambulatory Visit: Payer: Self-pay

## 2014-09-20 MED ORDER — OXYCODONE-ACETAMINOPHEN 5-325 MG PO TABS: 1.0000 | ORAL_TABLET | Freq: Four times a day (QID) | ORAL | Status: DC | PRN

## 2014-09-20 MED ORDER — PRENATAL MULTIVITAMIN CH: 1.0000 | ORAL_TABLET | Freq: Every day | ORAL | Status: DC

## 2014-09-20 MED ORDER — IBUPROFEN 800 MG PO TABS: 800.0000 mg | ORAL_TABLET | Freq: Three times a day (TID) | ORAL | Status: DC | PRN

## 2014-09-20 NOTE — Progress Notes (Signed)
Post Partum Day 2 Subjective: no complaints, up ad lib, voiding, tolerating PO and nl lochia, pain controlled  Objective: Blood pressure 135/76, pulse 82, temperature 98.3 F (36.8 C), temperature source Oral, resp. rate 18, last menstrual period 12/08/2013, SpO2 98 %, unknown if currently breastfeeding.  Physical Exam:  General: alert and no distress Lochia: appropriate Uterine Fundus: firm   Recent Labs  09/18/14 0730  HGB 12.0  HCT 36.5    Assessment/Plan: Discharge home, Breastfeeding and Lactation consult.  Routine care.  D/c with motrin, percocet and PNV.  F/u 6 weeks   LOS: 2 days   Bovard-Stuckert, Marinna Blane 09/20/2014, 8:02 AM

## 2014-09-20 NOTE — Lactation Note (Signed)
This note was copied from the chart of [facility]. Lactation Consultation Note  Patient Name: Boy Shaelin Lalley ZOXWR'U Date: 09/20/2014 Reason for consult: Follow-up assessment;Other (Comment) (mom ready for D/C )  Per mom last fed at 0830 10 mins , Breast feeding range 10 -30 mins and several feedings less than 10 mins ( snacks ) to start latch scores  9-5-7-5-, in the last 24 hours 8-9;s. Bili check at 33 hours - 3.3  Baby is 42 hours old and has been to the breast consistently. Per mom and dad the latch is good, Is scheduled is just odd and cluster fed all night. LC reviewed feeding patterns of a newborn . LC stressed the importance of skin to skin feedings, feeding cues, and feeding around the clock at least 8 feedings in 24 hours, may be more. Sore nipple and engorgement prevention and tx if needed. Referring to the Baby and me booklet.  Mother informed of post-discharge support and given phone number to the lactation department, including services for phone call assistance; out-patient  appointments; and breastfeeding support group. List of other breastfeeding resources in the community given in the handout. Encouraged mother to call for  problems or concerns related to breastfeeding.   Maternal Data    Feeding    LATCH Score/Interventions                Intervention(s): Breastfeeding basics reviewed     Lactation Tools Discussed/Used     Consult Status Consult Status: Complete Date: 09/20/14    Kathrin Greathouse 09/20/2014, 11:15 AM

## 2014-09-20 NOTE — Discharge Summary (Signed)
Obstetric Discharge Summary Reason for Admission: induction of labor Prenatal Procedures: none Intrapartum Procedures: spontaneous vaginal delivery Postpartum Procedures: none Complications-Operative and Postpartum: 2nd  degree perineal laceration HEMOGLOBIN  Date Value Ref Range Status  09/18/2014 12.0 12.0 - 15.0 g/dL Final   HCT  Date Value Ref Range Status  09/18/2014 36.5 36.0 - 46.0 % Final    Physical Exam:  General: alert and no distress Lochia: appropriate Uterine Fundus: firm  Discharge Diagnoses: Term Pregnancy-delivered  Discharge Information: Date: 09/20/2014 Activity: pelvic rest Diet: routine Medications: PNV, Ibuprofen and Percocet Condition: stable Instructions: refer to practice specific booklet Discharge to: home Follow-up Information    Follow up with MEISINGER,TODD D, MD. Schedule an appointment as soon as possible for a visit in 6 weeks.   Specialty:  Obstetrics and Gynecology   Why:  for postpartum visit   Contact information:   [address], [address] [place] [address] [phone]       Newborn Data: Live born female  Birth Weight: 6 lb 14 oz (3118 g) APGAR: 6, 9  Home with mother.  Bovard-Stuckert, Matty Deamer 09/20/2014, 8:35 AM

## 2016-03-10 DIAGNOSIS — H66002 Acute suppurative otitis media without spontaneous rupture of ear drum, left ear: Secondary | ICD-10-CM | POA: Diagnosis not present

## 2016-05-06 DIAGNOSIS — H9209 Otalgia, unspecified ear: Secondary | ICD-10-CM | POA: Diagnosis not present

## 2016-05-06 DIAGNOSIS — Z20818 Contact with and (suspected) exposure to other bacterial communicable diseases: Secondary | ICD-10-CM | POA: Diagnosis not present

## 2016-11-07 DIAGNOSIS — Z6822 Body mass index (BMI) 22.0-22.9, adult: Secondary | ICD-10-CM | POA: Diagnosis not present

## 2016-11-07 DIAGNOSIS — Z01419 Encounter for gynecological examination (general) (routine) without abnormal findings: Secondary | ICD-10-CM | POA: Diagnosis not present

## 2016-11-07 DIAGNOSIS — Z1389 Encounter for screening for other disorder: Secondary | ICD-10-CM | POA: Diagnosis not present

## 2016-11-07 DIAGNOSIS — Z3041 Encounter for surveillance of contraceptive pills: Secondary | ICD-10-CM | POA: Diagnosis not present

## 2016-11-07 DIAGNOSIS — Z130 Encounter for screening for diseases of the blood and blood-forming organs and certain disorders involving the immune mechanism: Secondary | ICD-10-CM | POA: Diagnosis not present

## 2016-12-22 DIAGNOSIS — D224 Melanocytic nevi of scalp and neck: Secondary | ICD-10-CM | POA: Diagnosis not present

## 2016-12-22 DIAGNOSIS — D2261 Melanocytic nevi of right upper limb, including shoulder: Secondary | ICD-10-CM | POA: Diagnosis not present

## 2016-12-22 DIAGNOSIS — D2239 Melanocytic nevi of other parts of face: Secondary | ICD-10-CM | POA: Diagnosis not present

## 2016-12-22 DIAGNOSIS — D2262 Melanocytic nevi of left upper limb, including shoulder: Secondary | ICD-10-CM | POA: Diagnosis not present

## 2017-01-05 DIAGNOSIS — Z131 Encounter for screening for diabetes mellitus: Secondary | ICD-10-CM | POA: Diagnosis not present

## 2017-01-05 DIAGNOSIS — J301 Allergic rhinitis due to pollen: Secondary | ICD-10-CM | POA: Diagnosis not present

## 2017-01-05 DIAGNOSIS — Z23 Encounter for immunization: Secondary | ICD-10-CM | POA: Diagnosis not present

## 2017-01-05 DIAGNOSIS — Z136 Encounter for screening for cardiovascular disorders: Secondary | ICD-10-CM | POA: Diagnosis not present

## 2017-01-05 DIAGNOSIS — Z0000 Encounter for general adult medical examination without abnormal findings: Secondary | ICD-10-CM | POA: Diagnosis not present

## 2017-01-05 DIAGNOSIS — K219 Gastro-esophageal reflux disease without esophagitis: Secondary | ICD-10-CM | POA: Diagnosis not present

## 2017-01-25 DIAGNOSIS — J069 Acute upper respiratory infection, unspecified: Secondary | ICD-10-CM | POA: Diagnosis not present

## 2017-03-03 DIAGNOSIS — J329 Chronic sinusitis, unspecified: Secondary | ICD-10-CM | POA: Diagnosis not present

## 2017-05-13 ENCOUNTER — Other Ambulatory Visit: Payer: Self-pay

## 2017-05-13 ENCOUNTER — Emergency Department (HOSPITAL_BASED_OUTPATIENT_CLINIC_OR_DEPARTMENT_OTHER)
Admission: EM | Admit: 2017-05-13 | Discharge: 2017-05-13 | Disposition: A | Discharge status: Home / Self Care | Payer: BLUE CROSS/BLUE SHIELD | Attending: Emergency Medicine | Admitting: Emergency Medicine

## 2017-05-13 ENCOUNTER — Encounter (HOSPITAL_BASED_OUTPATIENT_CLINIC_OR_DEPARTMENT_OTHER): Payer: Self-pay | Admitting: Emergency Medicine

## 2017-05-13 ENCOUNTER — Emergency Department (HOSPITAL_BASED_OUTPATIENT_CLINIC_OR_DEPARTMENT_OTHER): Payer: BLUE CROSS/BLUE SHIELD

## 2017-05-13 DIAGNOSIS — M79661 Pain in right lower leg: Secondary | ICD-10-CM

## 2017-05-13 DIAGNOSIS — M79662 Pain in left lower leg: Secondary | ICD-10-CM | POA: Diagnosis not present

## 2017-05-13 DIAGNOSIS — Z79899 Other long term (current) drug therapy: Secondary | ICD-10-CM | POA: Insufficient documentation

## 2017-05-13 DIAGNOSIS — M79605 Pain in left leg: Secondary | ICD-10-CM | POA: Diagnosis present

## 2017-05-13 NOTE — ED Triage Notes (Signed)
L calf pain x 1 week. Recent 11 hour car trip.

## 2017-05-13 NOTE — Discharge Instructions (Signed)
Today ultrasound was performed that shows you do not have any evidence of blood clots in your left leg.  Based on the change in activity prior to your symptoms starting I suspect that your leg muscles may be irritated from this change.  Please follow-up with your primary care doctor.  I have given you the information for Dr. Pearletha ForgeHudnall is an orthopedist here in case your symptoms fail to improve.  Please consider wearing compression socks.

## 2017-05-13 NOTE — ED Notes (Signed)
Patient transported to Ultrasound 

## 2017-05-14 NOTE — ED Provider Notes (Signed)
[facility] EMERGENCY DEPARTMENT Provider Note   CSN: [id number] Arrival date & time: 05/13/17  1327     History   Chief Complaint Chief Complaint  Patient presents with  . Leg Pain    HPI Taylor Payne is a 37 y.o. female who presents today for evaluation of left calf pain.  She reports that she has had a long travel recently and change in activity with increased walking.  Initially her bilateral legs were sore and felt achy, however currently only her left calf is sore.  This has been present for 1-2 weeks.  It is intermittent in nature and she has had multiple pain free days since her symptoms started. She denies fevers or chills.  She has no personal history of blood clots.    HPI  Past Medical History:  Diagnosis Date  . GERD (gastroesophageal reflux disease)   . Hx of varicella   . IBS (irritable bowel syndrome)     Patient Active Problem List   Diagnosis Date Noted  . Normal pregnancy, first 09/18/2014  . SVD (spontaneous vaginal delivery) 09/18/2014    History reviewed. No pertinent surgical history.  OB History    Gravida Para Term Preterm AB Living   1 1 1     1    SAB TAB Ectopic Multiple Live Births         0 1       Home Medications    Prior to Admission medications   Medication Sig Start Date End Date Taking? Authorizing Provider  ibuprofen (ADVIL,MOTRIN) 800 MG tablet Take 1 tablet (800 mg total) by mouth every 8 (eight) hours as needed. 09/20/14   Bovard-Stuckert, Augusto GambleJody, MD  levocetirizine (XYZAL) 5 MG tablet Take 2.5 mg by mouth every evening.    [provider]  mometasone (NASONEX) 50 MCG/ACT nasal spray Place 2 sprays into the nose daily.    [provider]  oxyCODONE-acetaminophen (PERCOCET/ROXICET) 5-325 MG per tablet Take 1-2 tablets by mouth every 6 (six) hours as needed for severe pain. 09/20/14   Bovard-Stuckert, Augusto GambleJody, MD  Prenatal Vit-Fe Fumarate-FA (PRENATAL MULTIVITAMIN) TABS tablet Take 1 tablet by mouth  daily at 12 noon. 09/20/14   Bovard-Stuckert, Augusto GambleJody, MD  ranitidine (ZANTAC) 300 MG capsule Take 300 mg by mouth every evening.    [provider]    Family History No family history on file.  Social History Social History   Tobacco Use  . Smoking status: Never Smoker  . Smokeless tobacco: Never Used  Substance Use Topics  . Alcohol use: Yes  . Drug use: No     Allergies   Penicillins and Ocuflox [ofloxacin]   Review of Systems Review of Systems  Constitutional: Negative for chills and fever.  Musculoskeletal:       Left calf pain  Skin: Negative for color change, pallor, rash and wound.  All other systems reviewed and are negative.    Physical Exam Updated Vital Signs BP 121/74   Pulse 80   Temp 98.1 F (36.7 C) (Oral)   Resp 20   Ht 5\' 3"  (1.6 m)   Wt 56.7 kg (125 lb)   LMP 04/29/2017   SpO2 100%   BMI 22.14 kg/m   Physical Exam  Constitutional: She appears well-developed and well-nourished.  HENT:  Head: Normocephalic and atraumatic.  Cardiovascular: Intact distal pulses.  2+ DP/PT pulses bilaterally.    Musculoskeletal:  There is diffuse pain and TTP over posterior aspect of the proximal left lower  leg.  There is no deformity, crepitus, induration or ecchymosis present.    5/5 strength in bilateral lower extremities.   Neurological:  Sensation intact to bilateral lower extremities.   Skin: She is not diaphoretic.  No obvious skin changes or defects over the left proximal calf.   Nursing note and vitals reviewed.    ED Treatments / Results  Labs (all labs ordered are listed, but only abnormal results are displayed) Labs Reviewed - No data to display  EKG  EKG Interpretation None       Radiology US Venous Img Lower Unilateral Left  Result Date: 05/13/2017 CLINICAL DATA:  Left lower extremity pain EXAM: LEFT LOWER EXTREMITY VENOUS DUPLEX ULTRASOUND TECHNIQUE: Gray-scale sonography with graded compression, as well as color Doppler  and duplex ultrasound were performed to evaluate the left lower extremity deep venous system from the level of the common femoral vein and including the common femoral, femoral, profunda femoral, popliteal and calf veins including the posterior tibial, peroneal and gastrocnemius veins when visible. The superficial great saphenous vein was also interrogated. Spectral Doppler was utilized to evaluate flow at rest and with distal augmentation maneuvers in the common femoral, femoral and popliteal veins. COMPARISON:  None. FINDINGS: Contralateral Common Femoral Vein: Respiratory phasicity is normal and symmetric with the symptomatic side. No evidence of thrombus. Normal compressibility. Common Femoral Vein: No evidence of thrombus. Normal compressibility, respiratory phasicity and response to augmentation. Saphenofemoral Junction: No evidence of thrombus. Normal compressibility and flow on color Doppler imaging. Profunda Femoral Vein: No evidence of thrombus. Normal compressibility and flow on color Doppler imaging. 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 No evidence of thrombus. Normal compressibility, respiratory phasicity and response to augmentation. Popliteal Vein: No evidence of thrombus. Normal compressibility, respiratory phasicity and response to augmentation. Calf Veins: No evidence of thrombus. Normal compressibility and flow on color Doppler imaging. Superficial Great Saphenous Vein: No evidence of thrombus. Normal compressibility. Venous Reflux:  None. Other Findings:  None. IMPRESSION: No evidence of deep venous thrombosis in the left lower extremity. Right common femoral vein is also patent. Electronically Signed   By: Bretta Bang III M.D.   On: 05/13/2017 15:51    Procedures Procedures (including critical care time)  Medications Ordered in ED Medications - No data to display   Initial Impression / Assessment and Plan / ED Course  I have reviewed  the triage vital signs and the nursing notes.  Pertinent labs & imaging results that were available during my care of the patient were reviewed by me and considered in my medical decision making (see chart for details).    Patient presents today for evaluation of intermittent left calf pain since the 6th.  She was concerned for a blood clot in her leg.  Venous duplex obtained, no obvious blood clots in left leg.  No trauma so x-rays were not obtained.  Patient is an OT, was advised to wear compression socks and follow up with PCP.  She was advised on conservative care.  Return precautions discussed, discharged home.    Final Clinical Impressions(s) / ED Diagnoses   Final diagnoses:  Right calf pain    ED Discharge Orders    None       Norman Clay 05/14/17 0135    Tilden Fossa, MD 05/15/17 1249

## 2017-05-28 DIAGNOSIS — J018 Other acute sinusitis: Secondary | ICD-10-CM | POA: Diagnosis not present

## 2017-07-26 DIAGNOSIS — Z3201 Encounter for pregnancy test, result positive: Secondary | ICD-10-CM | POA: Diagnosis not present

## 2017-07-26 DIAGNOSIS — N911 Secondary amenorrhea: Secondary | ICD-10-CM | POA: Diagnosis not present

## 2017-08-08 DIAGNOSIS — Z113 Encounter for screening for infections with a predominantly sexual mode of transmission: Secondary | ICD-10-CM | POA: Diagnosis not present

## 2017-08-08 DIAGNOSIS — Z3A09 9 weeks gestation of pregnancy: Secondary | ICD-10-CM | POA: Diagnosis not present

## 2017-08-08 DIAGNOSIS — Z368A Encounter for antenatal screening for other genetic defects: Secondary | ICD-10-CM | POA: Diagnosis not present

## 2017-08-08 DIAGNOSIS — O09519 Supervision of elderly primigravida, unspecified trimester: Secondary | ICD-10-CM | POA: Diagnosis not present

## 2017-08-08 DIAGNOSIS — O09521 Supervision of elderly multigravida, first trimester: Secondary | ICD-10-CM | POA: Diagnosis not present

## 2017-08-08 DIAGNOSIS — Z3689 Encounter for other specified antenatal screening: Secondary | ICD-10-CM | POA: Diagnosis not present

## 2017-08-08 DIAGNOSIS — O26891 Other specified pregnancy related conditions, first trimester: Secondary | ICD-10-CM | POA: Diagnosis not present

## 2017-08-08 LAB — OB RESULTS CONSOLE GC/CHLAMYDIA
Chlamydia: NEGATIVE
GC PROBE AMP, GENITAL: NEGATIVE

## 2017-08-08 LAB — OB RESULTS CONSOLE HEPATITIS B SURFACE ANTIGEN: Hepatitis B Surface Ag: NEGATIVE

## 2017-08-08 LAB — OB RESULTS CONSOLE HIV ANTIBODY (ROUTINE TESTING): HIV: NONREACTIVE

## 2017-08-08 LAB — OB RESULTS CONSOLE ABO/RH: RH TYPE: POSITIVE

## 2017-08-08 LAB — OB RESULTS CONSOLE ANTIBODY SCREEN: ANTIBODY SCREEN: NEGATIVE

## 2017-08-08 LAB — OB RESULTS CONSOLE RPR: RPR: NONREACTIVE

## 2017-08-08 LAB — OB RESULTS CONSOLE RUBELLA ANTIBODY, IGM: Rubella: IMMUNE

## 2017-09-06 DIAGNOSIS — O09521 Supervision of elderly multigravida, first trimester: Secondary | ICD-10-CM | POA: Diagnosis not present

## 2017-10-10 DIAGNOSIS — Z3A18 18 weeks gestation of pregnancy: Secondary | ICD-10-CM | POA: Diagnosis not present

## 2017-10-10 DIAGNOSIS — O09512 Supervision of elderly primigravida, second trimester: Secondary | ICD-10-CM | POA: Diagnosis not present

## 2017-10-10 DIAGNOSIS — Z363 Encounter for antenatal screening for malformations: Secondary | ICD-10-CM | POA: Diagnosis not present

## 2017-10-10 DIAGNOSIS — Z36 Encounter for antenatal screening for chromosomal anomalies: Secondary | ICD-10-CM | POA: Diagnosis not present

## 2017-11-14 DIAGNOSIS — O09512 Supervision of elderly primigravida, second trimester: Secondary | ICD-10-CM | POA: Diagnosis not present

## 2017-11-14 DIAGNOSIS — Z3A23 23 weeks gestation of pregnancy: Secondary | ICD-10-CM | POA: Diagnosis not present

## 2017-11-14 DIAGNOSIS — Z362 Encounter for other antenatal screening follow-up: Secondary | ICD-10-CM | POA: Diagnosis not present

## 2017-12-08 DIAGNOSIS — Z23 Encounter for immunization: Secondary | ICD-10-CM | POA: Diagnosis not present

## 2017-12-08 DIAGNOSIS — Z3689 Encounter for other specified antenatal screening: Secondary | ICD-10-CM | POA: Diagnosis not present

## 2017-12-14 DIAGNOSIS — D2261 Melanocytic nevi of right upper limb, including shoulder: Secondary | ICD-10-CM | POA: Diagnosis not present

## 2017-12-14 DIAGNOSIS — D2271 Melanocytic nevi of right lower limb, including hip: Secondary | ICD-10-CM | POA: Diagnosis not present

## 2017-12-14 DIAGNOSIS — D2262 Melanocytic nevi of left upper limb, including shoulder: Secondary | ICD-10-CM | POA: Diagnosis not present

## 2017-12-14 DIAGNOSIS — D225 Melanocytic nevi of trunk: Secondary | ICD-10-CM | POA: Diagnosis not present

## 2018-02-08 DIAGNOSIS — Z3685 Encounter for antenatal screening for Streptococcus B: Secondary | ICD-10-CM | POA: Diagnosis not present

## 2018-02-08 LAB — OB RESULTS CONSOLE GBS: STREP GROUP B AG: NEGATIVE

## 2018-02-28 NOTE — L&D Delivery Note (Signed)
Delivery Note Pt progressed to 7 cm and was sitting for an epidural.  She began involuntarily pushing and was 8-9 cm and I was called to come for delivery and left my office immediately.  When I entered the room the baby was on mom's chest with cord clamped and cut. At 4:14 PM a viable female was delivered via Vaginal, Spontaneous.  APGAR:8 ,9 ; weight pending.   Placenta status: spontaneous, intact.    Anesthesia:  Local Episiotomy: None Lacerations: 2nd degree;Perineal Suture Repair: 3.0 vicryl rapide Est. Blood Loss (mL):  200  Mom to postpartum.  Baby to Couplet care / Skin to Skin.  They desire circumcision, will do in am.  Leighton Roach Loda Bialas 03/06/2018, 4:44 PM

## 2018-03-01 ENCOUNTER — Encounter (HOSPITAL_COMMUNITY): Payer: Self-pay | Admitting: *Deleted

## 2018-03-01 ENCOUNTER — Telehealth (HOSPITAL_COMMUNITY): Payer: Self-pay | Admitting: *Deleted

## 2018-03-01 NOTE — Telephone Encounter (Signed)
Preadmission screen  

## 2018-03-02 ENCOUNTER — Telehealth (HOSPITAL_COMMUNITY): Payer: Self-pay | Admitting: *Deleted

## 2018-03-02 NOTE — Telephone Encounter (Signed)
Preadmission screen  

## 2018-03-06 ENCOUNTER — Encounter (HOSPITAL_COMMUNITY): Payer: Self-pay

## 2018-03-06 ENCOUNTER — Inpatient Hospital Stay (HOSPITAL_COMMUNITY)
Admission: RE | Admit: 2018-03-06 | Discharge: 2018-03-08 | DRG: 807 | Disposition: A | Discharge status: Home / Self Care | Payer: BLUE CROSS/BLUE SHIELD | Attending: Obstetrics and Gynecology | Admitting: Obstetrics and Gynecology

## 2018-03-06 ENCOUNTER — Other Ambulatory Visit: Payer: Self-pay

## 2018-03-06 DIAGNOSIS — O26893 Other specified pregnancy related conditions, third trimester: Secondary | ICD-10-CM | POA: Diagnosis not present

## 2018-03-06 DIAGNOSIS — Z3A39 39 weeks gestation of pregnancy: Secondary | ICD-10-CM | POA: Diagnosis not present

## 2018-03-06 DIAGNOSIS — Z23 Encounter for immunization: Secondary | ICD-10-CM | POA: Diagnosis not present

## 2018-03-06 LAB — TYPE AND SCREEN
ABO/RH(D): O POS
Antibody Screen: NEGATIVE

## 2018-03-06 LAB — CBC
HCT: 39.1 % (ref 36.0–46.0)
HEMOGLOBIN: 12.7 g/dL (ref 12.0–15.0)
MCH: 27.7 pg (ref 26.0–34.0)
MCHC: 32.5 g/dL (ref 30.0–36.0)
MCV: 85.2 fL (ref 80.0–100.0)
PLATELETS: 171 10*3/uL (ref 150–400)
RBC: 4.59 MIL/uL (ref 3.87–5.11)
RDW: 15.8 % — ABNORMAL HIGH (ref 11.5–15.5)
WBC: 12.5 10*3/uL — ABNORMAL HIGH (ref 4.0–10.5)
nRBC: 0 % (ref 0.0–0.2)

## 2018-03-06 LAB — ABO/RH: ABO/RH(D): O POS

## 2018-03-06 MED ORDER — ONDANSETRON HCL 4 MG PO TABS: 4.0000 mg | ORAL_TABLET | ORAL | Status: DC | PRN

## 2018-03-06 MED ORDER — EPHEDRINE 5 MG/ML INJ
10.0000 mg | INTRAVENOUS | Status: DC | PRN
  Filled 2018-03-06: qty 2

## 2018-03-06 MED ORDER — LIDOCAINE HCL (PF) 1 % IJ SOLN
30.0000 mL | INTRAMUSCULAR | Status: DC | PRN
  Administered 2018-03-06: 30 mL via SUBCUTANEOUS
  Filled 2018-03-06: qty 30

## 2018-03-06 MED ORDER — OXYCODONE HCL 5 MG PO TABS
5.0000 mg | ORAL_TABLET | ORAL | Status: DC | PRN
  Administered 2018-03-07: 5 mg via ORAL
  Filled 2018-03-06: qty 1

## 2018-03-06 MED ORDER — ONDANSETRON HCL 4 MG/2ML IJ SOLN: 4.0000 mg | INTRAMUSCULAR | Status: DC | PRN

## 2018-03-06 MED ORDER — PHENYLEPHRINE 40 MCG/ML (10ML) SYRINGE FOR IV PUSH (FOR BLOOD PRESSURE SUPPORT)
80.0000 ug | PREFILLED_SYRINGE | INTRAVENOUS | Status: DC | PRN
  Filled 2018-03-06 (×2): qty 10

## 2018-03-06 MED ORDER — DIBUCAINE 1 % RE OINT: 1.0000 "application " | TOPICAL_OINTMENT | RECTAL | Status: DC | PRN

## 2018-03-06 MED ORDER — TERBUTALINE SULFATE 1 MG/ML IJ SOLN
0.2500 mg | Freq: Once | INTRAMUSCULAR | Status: DC | PRN
  Filled 2018-03-06: qty 1

## 2018-03-06 MED ORDER — OXYCODONE-ACETAMINOPHEN 5-325 MG PO TABS: 2.0000 | ORAL_TABLET | ORAL | Status: DC | PRN

## 2018-03-06 MED ORDER — MEASLES, MUMPS & RUBELLA VAC IJ SOLR: 0.5000 mL | Freq: Once | INTRAMUSCULAR | Status: DC

## 2018-03-06 MED ORDER — PHENYLEPHRINE 40 MCG/ML (10ML) SYRINGE FOR IV PUSH (FOR BLOOD PRESSURE SUPPORT)
80.0000 ug | PREFILLED_SYRINGE | INTRAVENOUS | Status: DC | PRN
  Filled 2018-03-06: qty 10

## 2018-03-06 MED ORDER — METHYLERGONOVINE MALEATE 0.2 MG PO TABS: 0.2000 mg | ORAL_TABLET | ORAL | Status: DC | PRN

## 2018-03-06 MED ORDER — BENZOCAINE-MENTHOL 20-0.5 % EX AERO
1.0000 "application " | INHALATION_SPRAY | CUTANEOUS | Status: DC | PRN
  Administered 2018-03-06 – 2018-03-07 (×2): 1 via TOPICAL
  Filled 2018-03-06 (×2): qty 56

## 2018-03-06 MED ORDER — COCONUT OIL OIL: 1.0000 "application " | TOPICAL_OIL | Status: DC | PRN

## 2018-03-06 MED ORDER — OXYCODONE-ACETAMINOPHEN 5-325 MG PO TABS: 1.0000 | ORAL_TABLET | ORAL | Status: DC | PRN

## 2018-03-06 MED ORDER — TETANUS-DIPHTH-ACELL PERTUSSIS 5-2.5-18.5 LF-MCG/0.5 IM SUSP: 0.5000 mL | Freq: Once | INTRAMUSCULAR | Status: DC

## 2018-03-06 MED ORDER — SIMETHICONE 80 MG PO CHEW: 80.0000 mg | CHEWABLE_TABLET | ORAL | Status: DC | PRN

## 2018-03-06 MED ORDER — WITCH HAZEL-GLYCERIN EX PADS: 1.0000 "application " | MEDICATED_PAD | CUTANEOUS | Status: DC | PRN

## 2018-03-06 MED ORDER — DIPHENHYDRAMINE HCL 50 MG/ML IJ SOLN: 12.5000 mg | INTRAMUSCULAR | Status: DC | PRN

## 2018-03-06 MED ORDER — SOD CITRATE-CITRIC ACID 500-334 MG/5ML PO SOLN: 30.0000 mL | ORAL | Status: DC | PRN

## 2018-03-06 MED ORDER — ZOLPIDEM TARTRATE 5 MG PO TABS: 5.0000 mg | ORAL_TABLET | Freq: Every evening | ORAL | Status: DC | PRN

## 2018-03-06 MED ORDER — ONDANSETRON HCL 4 MG/2ML IJ SOLN: 4.0000 mg | Freq: Four times a day (QID) | INTRAMUSCULAR | Status: DC | PRN

## 2018-03-06 MED ORDER — PRENATAL MULTIVITAMIN CH
1.0000 | ORAL_TABLET | Freq: Every day | ORAL | Status: DC
  Administered 2018-03-07: 1 via ORAL
  Filled 2018-03-06: qty 1

## 2018-03-06 MED ORDER — MAGNESIUM HYDROXIDE 400 MG/5ML PO SUSP: 30.0000 mL | ORAL | Status: DC | PRN

## 2018-03-06 MED ORDER — SENNOSIDES-DOCUSATE SODIUM 8.6-50 MG PO TABS
2.0000 | ORAL_TABLET | ORAL | Status: DC
  Administered 2018-03-06 – 2018-03-07 (×2): 2 via ORAL
  Filled 2018-03-06 (×2): qty 2

## 2018-03-06 MED ORDER — LACTATED RINGERS IV SOLN
INTRAVENOUS | Status: DC
  Administered 2018-03-06 (×2): via INTRAVENOUS

## 2018-03-06 MED ORDER — ACETAMINOPHEN 325 MG PO TABS
650.0000 mg | ORAL_TABLET | ORAL | Status: DC | PRN
  Administered 2018-03-06 – 2018-03-08 (×2): 650 mg via ORAL
  Filled 2018-03-06 (×2): qty 2

## 2018-03-06 MED ORDER — IBUPROFEN 600 MG PO TABS
600.0000 mg | ORAL_TABLET | Freq: Four times a day (QID) | ORAL | Status: DC
  Administered 2018-03-06 – 2018-03-08 (×7): 600 mg via ORAL
  Filled 2018-03-06 (×7): qty 1

## 2018-03-06 MED ORDER — ACETAMINOPHEN 325 MG PO TABS: 650.0000 mg | ORAL_TABLET | ORAL | Status: DC | PRN

## 2018-03-06 MED ORDER — OXYTOCIN 40 UNITS IN NORMAL SALINE INFUSION - SIMPLE MED
2.5000 [IU]/h | INTRAVENOUS | Status: DC
  Filled 2018-03-06: qty 1000

## 2018-03-06 MED ORDER — METHYLERGONOVINE MALEATE 0.2 MG/ML IJ SOLN: 0.2000 mg | INTRAMUSCULAR | Status: DC | PRN

## 2018-03-06 MED ORDER — OXYTOCIN 40 UNITS IN NORMAL SALINE INFUSION - SIMPLE MED
1.0000 m[IU]/min | INTRAVENOUS | Status: DC
  Administered 2018-03-06: 2 m[IU]/min via INTRAVENOUS
  Filled 2018-03-06 (×2): qty 1000

## 2018-03-06 MED ORDER — DIPHENHYDRAMINE HCL 25 MG PO CAPS: 25.0000 mg | ORAL_CAPSULE | Freq: Four times a day (QID) | ORAL | Status: DC | PRN

## 2018-03-06 MED ORDER — OXYTOCIN BOLUS FROM INFUSION: 500.0000 mL | Freq: Once | INTRAVENOUS | Status: DC

## 2018-03-06 MED ORDER — BUTORPHANOL TARTRATE 1 MG/ML IJ SOLN
1.0000 mg | INTRAMUSCULAR | Status: DC | PRN
  Administered 2018-03-06 (×2): 1 mg via INTRAVENOUS
  Filled 2018-03-06 (×2): qty 1

## 2018-03-06 MED ORDER — FENTANYL 2.5 MCG/ML BUPIVACAINE 1/10 % EPIDURAL INFUSION (WH - ANES)
14.0000 mL/h | INTRAMUSCULAR | Status: DC | PRN
  Filled 2018-03-06: qty 100

## 2018-03-06 MED ORDER — LACTATED RINGERS IV SOLN: 500.0000 mL | Freq: Once | INTRAVENOUS | Status: DC

## 2018-03-06 MED ORDER — OXYCODONE HCL 5 MG PO TABS: 10.0000 mg | ORAL_TABLET | ORAL | Status: DC | PRN

## 2018-03-06 MED ORDER — LACTATED RINGERS IV SOLN: 500.0000 mL | INTRAVENOUS | Status: DC | PRN

## 2018-03-06 NOTE — Lactation Note (Signed)
This note was copied from a baby's chart. Lactation Consultation Note  Patient Name: Taylor Payne ZOXWR'UToday's Date: 03/06/2018  G2 P2 mom is an experienced breastfeeder. Breastfed first baby for 12 months then pumped and gave breastmilk in a cup for another few months. Mom reports  she has forgotten a lot about breastfeeding.  Infant in crib, cuing.  Asked parents to feed him.  Mom reports she just gave him a few sips.  Urged therm to offer dessert.  Infant coming off and on left breast.  But cuing.  Showed parents how to hand express and spoon feed colostrum to him when  Will not latch but cuing.   Urged to feed 8 times in 24 hours and on cue. Gave mom manual pump to prepump nipples and help them evert.   Left mom and baby STS.  Reviewed [facility] breastfeeding consultation Services, breastfeeding Resource list, and yellow feeding log.  Will follow up as needed    Maternal Data    Feeding Feeding Type: Breast Fed  [facility]                   Interventions    Lactation Tools Discussed/Used     Consult Status      Taylor Payne 03/06/2018, 10:51 PM

## 2018-03-06 NOTE — Progress Notes (Signed)
Not really feeling ctx Afeb, VSS FHT- 120-130, Cat I, ctx q 2-3 min on 8 mu/min pitocin VE-1-2/60/-1 to -2, vtx, AROM clear Continue pitocin, monitor progress, anticipate SVD

## 2018-03-06 NOTE — H&P (Signed)
Taylor Payne is a 38 y.o. female, G2 P1001, EGA 39+ weeks with EDC 1-9 presenting for elective induction.  Prenatal care essentially uncomplicated.  OB History    Gravida  2   Para  1   Term  1   Preterm      AB      Living  1     SAB      TAB      Ectopic      Multiple  0   Live Births  1          Past Medical History:  Diagnosis Date  . GERD (gastroesophageal reflux disease)   . Hx of varicella   . IBS (irritable bowel syndrome)    No past surgical history on file. Family History: family history is not on file. Social History:  reports that she has never smoked. She has never used smokeless tobacco. She reports current alcohol use. She reports that she does not use drugs.     Maternal Diabetes: No Genetic Screening: Normal Maternal Ultrasounds/Referrals: Normal Fetal Ultrasounds or other Referrals:  None Maternal Substance Abuse:  No Significant Maternal Medications:  None Significant Maternal Lab Results:  Lab values include: Group B Strep negative Other Comments:  None  Review of Systems  Respiratory: Negative.   Cardiovascular: Negative.    Maternal Medical History:  Fetal activity: Perceived fetal activity is normal.    Prenatal complications: no prenatal complications Prenatal Complications - Diabetes: none.      Last menstrual period 04/29/2017, unknown if currently breastfeeding. Maternal Exam:  Uterine Assessment: Contraction strength is mild.  Contraction frequency is rare.   Abdomen: Patient reports no abdominal tenderness. Estimated fetal weight is 7 lbs.   Fetal presentation: vertex  Introitus: Normal vulva. Normal vagina.  Amniotic fluid character: not assessed.  Pelvis: adequate for delivery.      Physical Exam  Constitutional: She appears well-developed and well-nourished.  Cardiovascular: Normal rate and regular rhythm.  Respiratory: Effort normal. No respiratory distress.  GI: Soft.  Genitourinary:    Vulva  normal.     Prenatal labs: ABO, Rh: O/Positive/-- (06/11 0000) Antibody: Negative (06/11 0000) Rubella: Immune (06/11 0000) RPR: Nonreactive (06/11 0000)  HBsAg: Negative (06/11 0000)  HIV: Non-reactive (06/11 0000)  GBS: Negative (12/12 0000)   Assessment/Plan: IUP at 39+ weeks for elective induction.  Will start pitocin and monitor progress, plan AROM when more dilated.  Risks of pitocin discussed.   Leighton Roach Elonda Giuliano 03/06/2018, 8:33 AM

## 2018-03-06 NOTE — Anesthesia Pain Management Evaluation Note (Signed)
  [facility] Pain Management Visit Note  Patient: Taylor Payne, 38 y.o., female  "Hello I am a member of the anesthesia team at [facility]. We have an anesthesia team available at all times to provide care throughout the hospital, including epidural management and anesthesia for C-section. I don't know your plan for the delivery whether it a natural birth, water birth, IV sedation, nitrous supplementation, doula or epidural, but we want to meet your pain goals."   1.Was your pain managed to your expectations on prior hospitalizations?   Yes   2.What is your expectation for pain management during this hospitalization?     Labor support without medications, Epidural, IV pain meds and Nitrous Oxide  3.How can we help you reach that goal? Pt open to discussion about all methods of pain control. All questions answered.  Record the patient's initial score and the patient's pain goal.   Pain: 0  Pain Goal: 10 [facility] wants you to be able to say your pain was always managed very well.  Yohannes Waibel 03/06/2018

## 2018-03-07 LAB — RPR: RPR Ser Ql: NONREACTIVE

## 2018-03-07 LAB — GROUP A STREP BY PCR: Group A Strep by PCR: NOT DETECTED

## 2018-03-07 NOTE — Progress Notes (Signed)
Post Partum Day 1 Subjective: no complaints, up ad lib, voiding, tolerating PO and nl lochia, pain controlled  Objective: Blood pressure 124/75, pulse 82, temperature 98.1 F (36.7 C), temperature source Oral, resp. rate 16, height 5\' 3"  (1.6 m), weight 70.3 kg, SpO2 97 %, unknown if currently breastfeeding.  Physical Exam:  General: alert and no distress Lochia: appropriate Uterine Fundus: firm  Recent Labs    03/06/18 0908  HGB 12.7  HCT 39.1    Assessment/Plan: Plan for discharge tomorrow, Breastfeeding and Lactation consult  Routine PP care   LOS: 1 day   Cambria Osten Bovard-Stuckert 03/07/2018, 8:07 AM

## 2018-03-07 NOTE — Progress Notes (Signed)
Patient stated her older son @ home has strep throat & she is now c/o sore throat, no fever. Redness to back of throat noted. Paged MD, awaitinf response @ this time. Instructed patient to take precautionss when coughing.

## 2018-03-08 ENCOUNTER — Inpatient Hospital Stay (HOSPITAL_COMMUNITY)
Admission: AD | Admit: 2018-03-08 | Payer: PRIVATE HEALTH INSURANCE | Source: Ambulatory Visit | Admitting: Obstetrics and Gynecology

## 2018-03-08 ENCOUNTER — Ambulatory Visit: Payer: Self-pay

## 2018-03-08 MED ORDER — IBUPROFEN 600 MG PO TABS: 600.0000 mg | ORAL_TABLET | Freq: Four times a day (QID) | ORAL | 1 refills | Status: DC

## 2018-03-08 MED ORDER — PRENATAL MULTIVITAMIN CH: 1.0000 | ORAL_TABLET | Freq: Every day | ORAL | 3 refills | Status: AC

## 2018-03-08 NOTE — Discharge Summary (Signed)
OB Discharge Summary     Patient Name: Taylor Payne DOB: 28-Jun-1980 MRN: [id number]  Date of admission: 03/06/2018 Delivering MD: Jackelyn Knife, TODD   Date of discharge: 03/08/2018  Admitting diagnosis: 36 wks, induction Intrauterine pregnancy: Unknown     Secondary diagnosis:  Active Problems:   Indication for care in labor or delivery  Additional problems:N/A     Discharge diagnosis: Term Pregnancy Delivered                                                                                                Post partum procedures:N/A  Augmentation: AROM and Pitocin  Complications: None  Hospital course:  Induction of Labor With Vaginal Delivery   38 y.o. yo G2P2002 at Unknown was admitted to the hospital 03/06/2018 for induction of labor.  Indication for induction: Favorable cervix at term.  Patient had an uncomplicated labor course as follows: Membrane Rupture Time/Date: 11:47 AM ,03/06/2018   Intrapartum Procedures: Episiotomy: None [1]                                         Lacerations:  2nd degree [3];Perineal [11]  Patient had delivery of a Viable infant.  Information for the patient's newborn:  Kempner, Vivia Hoxie [[id number]]  Delivery Method: Vaginal, Spontaneous(Filed from Delivery Summary)   03/06/2018  Details of delivery can be found in separate delivery note.  Patient had a routine postpartum course. Patient is discharged home 03/08/18.  Physical exam  Vitals:   03/07/18 0715 03/07/18 1433 03/07/18 2217 03/08/18 0550  BP: 124/75 101/67 103/61 115/73  Pulse: 82 85 86 89  Resp: 16 16 16 16   Temp: 98.1 F (36.7 C) 98.1 F (36.7 C) 98 F (36.7 C) 97.9 F (36.6 C)  TempSrc: Oral  Oral Oral  SpO2:   98%   Weight:      Height:       General: alert and no distress Lochia: appropriate Uterine Fundus: firm  Labs: Lab Results  Component Value Date   WBC 12.5 (H) 03/06/2018   HGB 12.7 03/06/2018   HCT 39.1 03/06/2018   MCV 85.2 03/06/2018   PLT 171 03/06/2018    CMP Latest Ref Rng & Units 11/12/2011  Glucose 70 - 99 mg/dL 90  BUN 6 - 23 mg/dL 8  Creatinine 9.48 - 0.16 mg/dL 5.53  Sodium 748 - 270 mEq/L 140  Potassium 3.5 - 5.1 mEq/L 3.9  Chloride 96 - 112 mEq/L 105    Discharge instruction: per After Visit Summary and "Baby and Me Booklet".  After visit meds:  Allergies as of 03/08/2018      Reactions   Penicillins Anaphylaxis   DID THE REACTION INVOLVE: Swelling of the face/tongue/throat, SOB, or low BP? y Sudden or severe rash/hives, skin peeling, or the inside of the mouth or nose? Yes Did it require medical treatment? No When did it last happen?teenager If all above answers are "NO", may proceed with cephalosporin use.   Ocuflox [ofloxacin] Swelling  And redness      Medication List    STOP taking these medications   SLOW RELEASE IRON PO     TAKE these medications   docusate sodium 100 MG capsule Commonly known as:  COLACE Take 200 mg by mouth daily as needed for mild constipation.   ibuprofen 600 MG tablet Commonly known as:  ADVIL,MOTRIN Take 1 tablet (600 mg total) by mouth every 6 (six) hours.   levocetirizine 5 MG tablet Commonly known as:  XYZAL Take 2.5 mg by mouth at bedtime.   mometasone 50 MCG/ACT nasal spray Commonly known as:  NASONEX Place 1 spray into the nose daily.   pantoprazole 40 MG tablet Commonly known as:  PROTONIX Take 40 mg by mouth at bedtime.   prenatal multivitamin Tabs tablet Take 1 tablet by mouth daily at 12 noon.       Diet: routine diet  Activity: Advance as tolerated. Pelvic rest for 6 weeks.   Outpatient follow up:6 weeks Follow up Appt:No future appointments. Follow up Visit:No follow-ups on file.  Postpartum contraception: Undecided  Newborn Data: Live born female  Birth Weight: 6 lb 14.2 oz (3124 g) APGAR: 9, 9  Newborn Delivery   Birth date/time:  03/06/2018 16:14:00 Delivery type:  Vaginal, Spontaneous     Baby Feeding: Breast Disposition:home with  mother   03/08/2018 Sherian Rein, MD

## 2018-03-08 NOTE — Lactation Note (Signed)
This note was copied from a baby's chart. Lactation Consultation Note  Patient Name: Boy Lady DeutscherKimberly Pingley MVHQI'OToday's Date: 03/08/2018 Reason for consult: Follow-up assessment  Baby is 4043 hours old / post circ / and 9% weight loss  LC reviewed doc flow sheets - voids and stools correlate with weight loss .  Parents report baby has not been staying latched for very long.  Baby awake after circ / sluggish to start/ gave 3ml of EBM with finger feeding.  Baby more awake and latched on and off / rubbing noise / lip feeding noted/  LC offered and sized mom for  #20 NS - to snug and #24 NS fit better/ filled with EBM and baby latched  With depth and fed for 15 mins / sustaining latch /5 FSNS with EBM inserted under the NS for supplementing.  Baby took total of 9 ml / and became non - nutritive / release the latch , nipple well rounded./ milk in the NS. Mom denies soreness/ Reviewed sore nipple and engorgement prevention and treatment. LC offered and LC O/P appt and mom receptive / [facility]  Per mom has 2 DEBP Medela - / also hand pump. Per mom pumped x 4 in the last 24 hours and milk is in .  Mother informed of post-discharge support and given phone number to the lactation department, including services for phone call assistance; out-patient appointments; and breastfeeding support group. List of other breastfeeding resources in the community given in the handout. Encouraged mother to call for problems or concerns related to breastfeeding.   Maternal Data Has patient been taught Hand Expression?: Yes Does the patient have breastfeeding experience prior to this delivery?: Yes  Feeding Feeding Type: Breast Fed  LATCH Score Latch: Grasps breast easily, tongue down, lips flanged, rhythmical sucking.  Audible Swallowing: Spontaneous and intermittent  Type of Nipple: Everted at rest and after stimulation  Comfort (Breast/Nipple): Filling, red/small  blisters or bruises, mild/mod discomfort  Hold (Positioning): Assistance needed to correctly position infant at breast and maintain latch.  LATCH Score: 8  Interventions Interventions: Breast feeding basics reviewed;Assisted with latch;Skin to skin;Breast compression;Adjust position;Support pillows;Position options  Lactation Tools Discussed/Used Tools: Nipple Shields;31F feeding tube / Syringe Nipple shield size: 20;24;Other (comment)(#24 NS better fit ) Shell Type: Inverted Breast pump type: Manual;Double-Electric Breast Pump WIC Program: No Pump Review: Milk Storage(DEBP was already set up )   Consult Status Consult Status: Follow-up Date: (LC offered and [facility] / mom receptive ) Follow-up type: Out-patient(LC placed request )    Kathrin GreathouseMargaret Ann Floree Zuniga 03/08/2018, 11:33 AM

## 2018-03-08 NOTE — Progress Notes (Addendum)
Post Partum Day 2 Subjective: no complaints, up ad lib, voiding, tolerating PO and nl lochia, pain controlled  Objective: Blood pressure 115/73, pulse 89, temperature 97.9 F (36.6 C), temperature source Oral, resp. rate 16, height 5\' 3"  (1.6 m), weight 70.3 kg, SpO2 98 %, unknown if currently breastfeeding.  Physical Exam:  General: alert and no distress Lochia: appropriate Uterine Fundus: firm  Recent Labs    03/06/18 0908  HGB 12.7  HCT 39.1    Assessment/Plan: Discharge home, Breastfeeding and Lactation consult.  Circumcision later today.     LOS: 2 days   Jody Bovard-Stuckert 03/08/2018, 6:45 AM

## 2018-03-13 DIAGNOSIS — R300 Dysuria: Secondary | ICD-10-CM | POA: Diagnosis not present

## 2018-04-30 DIAGNOSIS — Z30430 Encounter for insertion of intrauterine contraceptive device: Secondary | ICD-10-CM | POA: Diagnosis not present

## 2018-05-19 ENCOUNTER — Other Ambulatory Visit: Payer: Self-pay

## 2018-05-19 ENCOUNTER — Emergency Department (HOSPITAL_COMMUNITY): Payer: BLUE CROSS/BLUE SHIELD

## 2018-05-19 ENCOUNTER — Emergency Department (HOSPITAL_COMMUNITY)
Admission: EM | Admit: 2018-05-19 | Discharge: 2018-05-19 | Disposition: A | Discharge status: Home / Self Care | Payer: BLUE CROSS/BLUE SHIELD | Attending: Emergency Medicine | Admitting: Emergency Medicine

## 2018-05-19 DIAGNOSIS — N200 Calculus of kidney: Secondary | ICD-10-CM | POA: Diagnosis not present

## 2018-05-19 DIAGNOSIS — R109 Unspecified abdominal pain: Secondary | ICD-10-CM | POA: Diagnosis present

## 2018-05-19 DIAGNOSIS — N201 Calculus of ureter: Secondary | ICD-10-CM | POA: Diagnosis not present

## 2018-05-19 DIAGNOSIS — R102 Pelvic and perineal pain: Secondary | ICD-10-CM | POA: Insufficient documentation

## 2018-05-19 DIAGNOSIS — N61 Mastitis without abscess: Secondary | ICD-10-CM | POA: Insufficient documentation

## 2018-05-19 DIAGNOSIS — Z79899 Other long term (current) drug therapy: Secondary | ICD-10-CM | POA: Insufficient documentation

## 2018-05-19 DIAGNOSIS — R1031 Right lower quadrant pain: Secondary | ICD-10-CM | POA: Diagnosis not present

## 2018-05-19 LAB — COMPREHENSIVE METABOLIC PANEL
ALK PHOS: 128 U/L — AB (ref 38–126)
ALT: 24 U/L (ref 0–44)
AST: 20 U/L (ref 15–41)
Albumin: 4.1 g/dL (ref 3.5–5.0)
Anion gap: 13 (ref 5–15)
BUN: 12 mg/dL (ref 6–20)
CALCIUM: 9.9 mg/dL (ref 8.9–10.3)
CO2: 20 mmol/L — ABNORMAL LOW (ref 22–32)
Chloride: 109 mmol/L (ref 98–111)
Creatinine, Ser: 0.83 mg/dL (ref 0.44–1.00)
GFR calc Af Amer: >60 mL/min (ref >60)
GFR calc non Af Amer: >60 mL/min (ref >60)
Glucose, Bld: 112 mg/dL — ABNORMAL HIGH (ref 70–99)
Potassium: 3.9 mmol/L (ref 3.5–5.1)
Sodium: 142 mmol/L (ref 135–145)
Total Bilirubin: 0.5 mg/dL (ref 0.3–1.2)
Total Protein: 7.1 g/dL (ref 6.5–8.1)

## 2018-05-19 LAB — WET PREP, GENITAL
Clue Cells Wet Prep HPF POC: NONE SEEN
Sperm: NONE SEEN
Trich, Wet Prep: NONE SEEN
Yeast Wet Prep HPF POC: NONE SEEN

## 2018-05-19 LAB — CBC WITH DIFFERENTIAL/PLATELET
Abs Immature Granulocytes: 0.11 10*3/uL — ABNORMAL HIGH (ref 0.00–0.07)
Basophils Absolute: 0.1 10*3/uL (ref 0.0–0.1)
Basophils Relative: 0 %
Eosinophils Absolute: 0.2 10*3/uL (ref 0.0–0.5)
Eosinophils Relative: 1 %
HCT: 42.9 % (ref 36.0–46.0)
Hemoglobin: 13.9 g/dL (ref 12.0–15.0)
Immature Granulocytes: 1 %
Lymphocytes Relative: 20 %
Lymphs Abs: 3.3 10*3/uL (ref 0.7–4.0)
MCH: 26.1 pg (ref 26.0–34.0)
MCHC: 32.4 g/dL (ref 30.0–36.0)
MCV: 80.5 fL (ref 80.0–100.0)
Monocytes Absolute: 0.8 10*3/uL (ref 0.1–1.0)
Monocytes Relative: 5 %
Neutro Abs: 11.8 10*3/uL — ABNORMAL HIGH (ref 1.7–7.7)
Neutrophils Relative %: 73 %
Platelets: 317 10*3/uL (ref 150–400)
RBC: 5.33 MIL/uL — ABNORMAL HIGH (ref 3.87–5.11)
RDW: 13.5 % (ref 11.5–15.5)
WBC: 16.1 10*3/uL — ABNORMAL HIGH (ref 4.0–10.5)
nRBC: 0 % (ref 0.0–0.2)

## 2018-05-19 LAB — URINALYSIS, ROUTINE W REFLEX MICROSCOPIC
Bilirubin Urine: NEGATIVE
Glucose, UA: NEGATIVE mg/dL
Ketones, ur: 20 mg/dL — AB
Leukocytes,Ua: NEGATIVE
Nitrite: NEGATIVE
Protein, ur: NEGATIVE mg/dL
SPECIFIC GRAVITY, URINE: 1.012 (ref 1.005–1.030)
pH: 8 (ref 5.0–8.0)

## 2018-05-19 LAB — I-STAT BETA HCG BLOOD, ED (MC, WL, AP ONLY): I-stat hCG, quantitative: <5 m[IU]/mL (ref <5)

## 2018-05-19 LAB — LIPASE, BLOOD: Lipase: 30 U/L (ref 11–51)

## 2018-05-19 MED ORDER — SODIUM CHLORIDE 0.9 % IV BOLUS
1000.0000 mL | Freq: Once | INTRAVENOUS | Status: AC
  Administered 2018-05-19: 1000 mL via INTRAVENOUS

## 2018-05-19 MED ORDER — CLINDAMYCIN HCL 150 MG PO CAPS: 450.0000 mg | ORAL_CAPSULE | Freq: Once | ORAL | Status: DC

## 2018-05-19 MED ORDER — ONDANSETRON HCL 4 MG/2ML IJ SOLN
4.0000 mg | Freq: Once | INTRAMUSCULAR | Status: AC
  Administered 2018-05-19: 4 mg via INTRAVENOUS
  Filled 2018-05-19: qty 2

## 2018-05-19 MED ORDER — OXYCODONE-ACETAMINOPHEN 5-325 MG PO TABS: 1.0000 | ORAL_TABLET | ORAL | 0 refills | Status: AC | PRN

## 2018-05-19 MED ORDER — TAMSULOSIN HCL 0.4 MG PO CAPS: 0.4000 mg | ORAL_CAPSULE | Freq: Every day | ORAL | 0 refills | Status: AC

## 2018-05-19 MED ORDER — HYDROMORPHONE HCL 1 MG/ML IJ SOLN
1.0000 mg | Freq: Once | INTRAMUSCULAR | Status: AC
  Administered 2018-05-19: 1 mg via INTRAVENOUS
  Filled 2018-05-19: qty 1

## 2018-05-19 MED ORDER — HYDROMORPHONE HCL 1 MG/ML IJ SOLN
0.5000 mg | Freq: Once | INTRAMUSCULAR | Status: AC
  Administered 2018-05-19: 0.5 mg via INTRAVENOUS
  Filled 2018-05-19: qty 1

## 2018-05-19 MED ORDER — MORPHINE SULFATE (PF) 4 MG/ML IV SOLN
4.0000 mg | Freq: Once | INTRAVENOUS | Status: AC
  Administered 2018-05-19: 4 mg via INTRAVENOUS
  Filled 2018-05-19: qty 1

## 2018-05-19 MED ORDER — ONDANSETRON 4 MG PO TBDP: 4.0000 mg | ORAL_TABLET | Freq: Three times a day (TID) | ORAL | 0 refills | Status: AC | PRN

## 2018-05-19 MED ORDER — KETOROLAC TROMETHAMINE 30 MG/ML IJ SOLN
30.0000 mg | Freq: Once | INTRAMUSCULAR | Status: AC
  Administered 2018-05-19: 30 mg via INTRAVENOUS
  Filled 2018-05-19: qty 1

## 2018-05-19 MED ORDER — CEPHALEXIN 250 MG PO CAPS
500.0000 mg | ORAL_CAPSULE | Freq: Once | ORAL | Status: AC
  Administered 2018-05-19: 500 mg via ORAL
  Filled 2018-05-19: qty 2

## 2018-05-19 MED ORDER — CEPHALEXIN 500 MG PO CAPS: 500.0000 mg | ORAL_CAPSULE | Freq: Four times a day (QID) | ORAL | 0 refills | Status: AC

## 2018-05-19 MED ORDER — IBUPROFEN 600 MG PO TABS: 600.0000 mg | ORAL_TABLET | Freq: Four times a day (QID) | ORAL | 0 refills | Status: AC | PRN

## 2018-05-19 NOTE — Discharge Instructions (Signed)
Pump and dump after taking flomax

## 2018-05-19 NOTE — ED Triage Notes (Signed)
Pt c/o right sided flank pain that started this morning  Along with v/; pt denies any pain with urination

## 2018-05-19 NOTE — ED Notes (Signed)
Patient verbalizes understanding of discharge instructions. Opportunity for questioning and answering were provided. Armband removed by staff , patient discharged from ED. 

## 2018-05-19 NOTE — ED Provider Notes (Signed)
[facility] EMERGENCY DEPARTMENT Provider Note   CSN: [id number] Arrival date & time: 05/19/18  8119    History   Chief Complaint Chief Complaint  Patient presents with  . Flank Pain    HPI Taylor Payne is a 38 y.o. female.     Pt presents to the ED today with severe right flank pain.  Pt said it started this morning.  She had a vaginal delivery on 03/06/18 and is breast feeding.  She had an IUD placed on 3/2.  She has still been having intermittent vaginal bleeding.  No f/c.  Pt also concerned about some redness to her left breast.     Past Medical History:  Diagnosis Date  . GERD (gastroesophageal reflux disease)   . Hx of varicella   . IBS (irritable bowel syndrome)     Patient Active Problem List   Diagnosis Date Noted  . Indication for care in labor or delivery 03/06/2018  . Normal pregnancy, first 09/18/2014  . SVD (spontaneous vaginal delivery) 09/18/2014    No past surgical history on file.   OB History    Gravida  2   Para  2   Term  2   Preterm      AB      Living  2     SAB      TAB      Ectopic      Multiple  0   Live Births  2            Home Medications    Prior to Admission medications   Medication Sig Start Date End Date Taking? Authorizing Provider  docusate sodium (COLACE) 100 MG capsule Take 200 mg by mouth daily as needed for mild constipation.   Yes [provider]  levocetirizine (XYZAL) 5 MG tablet Take 2.5 mg by mouth at bedtime. 12/24/15  Yes [provider]  mometasone (NASONEX) 50 MCG/ACT nasal spray Place 1 spray into the nose daily. 12/26/15  Yes [provider]  pantoprazole (PROTONIX) 40 MG tablet Take 40 mg by mouth at bedtime.   Yes [provider]  Prenatal Vit-Fe Fumarate-FA (PRENATAL MULTIVITAMIN) TABS tablet Take 1 tablet by mouth daily at 12 noon. 03/08/18  Yes Bovard-Stuckert, Jody, MD  cephALEXin (KEFLEX) 500 MG capsule Take 1 capsule (500 mg  total) by mouth 4 (four) times daily. 05/19/18   Jacalyn Lefevre, MD  ibuprofen (ADVIL,MOTRIN) 600 MG tablet Take 1 tablet (600 mg total) by mouth every 6 (six) hours as needed. 05/19/18   Jacalyn Lefevre, MD  ondansetron (ZOFRAN ODT) 4 MG disintegrating tablet Take 1 tablet (4 mg total) by mouth every 8 (eight) hours as needed. 05/19/18   Jacalyn Lefevre, MD  oxyCODONE-acetaminophen (PERCOCET/ROXICET) 5-325 MG tablet Take 1 tablet by mouth every 4 (four) hours as needed for severe pain. 05/19/18   Jacalyn Lefevre, MD  tamsulosin (FLOMAX) 0.4 MG CAPS capsule Take 1 capsule (0.4 mg total) by mouth daily. 05/19/18   Jacalyn Lefevre, MD    Family History No family history on file.  Social History Social History   Tobacco Use  . Smoking status: Never Smoker  . Smokeless tobacco: Never Used  Substance Use Topics  . Alcohol use: Yes  . Drug use: No     Allergies   Penicillins and Ocuflox [ofloxacin]   Review of Systems Review of Systems  Gastrointestinal: Positive for abdominal pain and nausea.  Genitourinary: Positive for flank pain and vaginal bleeding.  All other systems reviewed and are negative.    Physical Exam Updated Vital Signs BP (!) 123/95   Pulse 92   Temp 98.1 F (36.7 C) (Oral)   Resp 17   Ht 5\' 3"  (1.6 m)   Wt 56.7 kg   SpO2 98%   BMI 22.14 kg/m   Physical Exam Vitals signs and nursing note reviewed. Exam conducted with a chaperone present.  Constitutional:      General: She is in acute distress.     Appearance: Normal appearance.  HENT:     Head: Normocephalic and atraumatic.     Right Ear: External ear normal.     Left Ear: External ear normal.     Nose: Nose normal.     Mouth/Throat:     Mouth: Mucous membranes are dry.  Eyes:     Extraocular Movements: Extraocular movements intact.     Conjunctiva/sclera: Conjunctivae normal.     Pupils: Pupils are equal, round, and reactive to light.  Neck:     Musculoskeletal: Normal range of motion and neck  supple.  Cardiovascular:     Rate and Rhythm: Normal rate and regular rhythm.     Pulses: Normal pulses.     Heart sounds: Normal heart sounds.  Pulmonary:     Effort: Pulmonary effort is normal.     Breath sounds: Normal breath sounds.  Chest:     Comments: Mastitis left breast Abdominal:     General: Abdomen is flat. Bowel sounds are normal.     Palpations: Abdomen is soft.     Tenderness: There is abdominal tenderness in the right lower quadrant.  Genitourinary:    Vagina: Normal.     Cervix: Cervical bleeding present.     Uterus: Normal.      Adnexa: Right adnexa normal and left adnexa normal.  Musculoskeletal: Normal range of motion.  Skin:    General: Skin is warm and dry.     Capillary Refill: Capillary refill takes less than 2 seconds.  Neurological:     General: No focal deficit present.     Mental Status: She is alert and oriented to person, place, and time.  Psychiatric:        Mood and Affect: Mood normal.        Behavior: Behavior normal.      ED Treatments / Results  Labs (all labs ordered are listed, but only abnormal results are displayed) Labs Reviewed  WET PREP, GENITAL - Abnormal; Notable for the following components:      Result Value   WBC, Wet Prep HPF POC MODERATE (*)    All other components within normal limits  CBC WITH DIFFERENTIAL/PLATELET - Abnormal; Notable for the following components:   WBC 16.1 (*)    RBC 5.33 (*)    Neutro Abs 11.8 (*)    Abs Immature Granulocytes 0.11 (*)    All other components within normal limits  COMPREHENSIVE METABOLIC PANEL - Abnormal; Notable for the following components:   CO2 20 (*)    Glucose, Bld 112 (*)    Alkaline Phosphatase 128 (*)    All other components within normal limits  URINALYSIS, ROUTINE W REFLEX MICROSCOPIC - Abnormal; Notable for the following components:   Hgb urine dipstick SMALL (*)    Ketones, ur 20 (*)    Bacteria, UA RARE (*)    All other components within normal limits  LIPASE,  BLOOD  I-STAT BETA HCG BLOOD, ED ([facility], [facility], [facility] ONLY)  GC/CHLAMYDIA  PROBE AMP ([place]) NOT AT [facility]    EKG None  Radiology Ct Renal Stone Study  Result Date: 05/19/2018 CLINICAL DATA:  Right flank pain as well as pelvic pain with nausea and vomiting. Symptoms began this morning. Suspect stone disease. EXAM: CT ABDOMEN AND PELVIS WITHOUT CONTRAST TECHNIQUE: Multidetector CT imaging of the abdomen and pelvis was performed following the standard protocol without IV contrast. COMPARISON:  None. FINDINGS: Lower chest: Lung bases are clear. Hepatobiliary: Liver, gallbladder and biliary tree are normal. Pancreas: Normal. Spleen: Normal. Adrenals/Urinary Tract: Adrenal glands are normal. Kidneys normal size. There are a few small bilateral punctate renal stones. Largest stone 3-4 mm over the lower pole right kidney. There is mild right-sided hydronephrosis. Dilatation of the right ureter as there is a 5 mm stone at the right UVJ causing this obstruction. Left ureter is normal. Bladder is unremarkable. Stomach/Bowel: Stomach and small bowel are normal. Appendix is normal. Colon is normal. Vascular/Lymphatic: Normal. Reproductive: IUD is superficially located over the uterine fundus and demonstrates steep oblique orientation close to horizontal orientation. Ovaries within normal. Other: None. Musculoskeletal: Within normal. IMPRESSION: Bilateral nephrolithiasis. 5 mm stone at the right UVJ causing low-grade obstruction. Superficial abnormally oriented IUD over the uterine fundus as question a degree of myometrial migration. Electronically Signed   By: Elberta Fortisaniel  Boyle M.D.   On: 05/19/2018 11:06    Procedures Procedures (including critical care time)  Medications Ordered in ED Medications  cephALEXin (KEFLEX) capsule 500 mg (has no administration in time range)  HYDROmorphone (DILAUDID) injection 0.5 mg (has no administration in time range)  ondansetron (ZOFRAN) injection 4 mg (4 mg Intravenous Given  05/19/18 0950)  sodium chloride 0.9 % bolus 1,000 mL (0 mLs Intravenous Stopped 05/19/18 1144)  morphine 4 MG/ML injection 4 mg (4 mg Intravenous Given 05/19/18 0950)  HYDROmorphone (DILAUDID) injection 1 mg (1 mg Intravenous Given 05/19/18 1027)  ketorolac (TORADOL) 30 MG/ML injection 30 mg (30 mg Intravenous Given 05/19/18 1145)  sodium chloride 0.9 % bolus 1,000 mL (1,000 mLs Intravenous New Bag/Given 05/19/18 1146)     Initial Impression / Assessment and Plan / ED Course  I have reviewed the triage vital signs and the nursing notes.  Pertinent labs & imaging results that were available during my care of the patient were reviewed by me and considered in my medical decision making (see chart for details).      Pt is feeling much better.  She is much more comfortable.  She is stable for d/c home.  She is told to pump and dump if she takes flomax.  She does have mastitis, so is told to breastfeed and avoid flomax unless her pain worsens.  She has a severe reaction to penicillin, but can take keflex, so she is d/c home on keflex.  Return if worse.  Final Clinical Impressions(s) / ED Diagnoses   Final diagnoses:  Right ureteral stone  Mastitis    ED Discharge Orders         Ordered    tamsulosin (FLOMAX) 0.4 MG CAPS capsule  Daily     05/19/18 1252    oxyCODONE-acetaminophen (PERCOCET/ROXICET) 5-325 MG tablet  Every 4 hours PRN     05/19/18 1252    ibuprofen (ADVIL,MOTRIN) 600 MG tablet  Every 6 hours PRN     05/19/18 1252    ondansetron (ZOFRAN ODT) 4 MG disintegrating tablet  Every 8 hours PRN     05/19/18 1252    cephALEXin (KEFLEX) 500 MG capsule  4 times daily     05/19/18 1305           Jacalyn Lefevre, MD 05/19/18 [phone]

## 2018-05-21 LAB — GC/CHLAMYDIA PROBE AMP (~~LOC~~) NOT AT ARMC
Chlamydia: NEGATIVE
Neisseria Gonorrhea: NEGATIVE

## 2018-06-06 ENCOUNTER — Telehealth (HOSPITAL_COMMUNITY): Payer: Self-pay

## 2018-06-06 NOTE — Telephone Encounter (Signed)
Mother phoned in a few weeks ago due to latch issues.Mother reports that her OB recommended seeing a LC about breast.   Started using the nipple shield while in the hospital and then infant became very gassy and reflux. MD told her to wean off the nipple shield. Mother weaned off the nipple shield.  Infant continued to have bad gas and reflux.  She reports that she has had perssistent cracked nipple on the Lt breast. She reports, she has had multiple plugged ducts, reports that she has been treated  for mastitis 2 times. She received two rounds of Keflex for Mastitis. Denies ever having a fever, aches or chills. Mother reports that she was also treated with Diflucan for 2,  14 day rounds. Mother reports that her cracks began to heal.  Mother reports that after treatment she was better for 2-3 weeks and the she started cracking again on the left nipple. She reports that her left breast started burning . She reports that the burning is consistent and that it is the whole breast.   Mother  denies redness, warmth, fever, aches , or chills.   Mother reports that she is making too much milk.  When infant is breastfeeding and she is at home. She pumps 3 times daily. She reports working 5 days a week and that she pumps every 2 hours while at work.   Mother reports that she went to the ER a few weeks ago and had a Kidney stone. She reports that the nurse saw her breast and reported to MD , her breast was red and warm to touch at this time. MD gave her antibiotic for Mastitis.   Mother has a 64 yr old. She is not resting enough. Mother reports that she is making too much milk and stays engorged.  Plan of care: Mother advised to follow up next week with LC. Not placed in basket for mother to get a call to schedule an appt.  Advised mother to limit amt of mins that she is pumping and pump onlly for comfort.  Suggested that mother phone OB and get APNO RX.  Advised mother to follow up with Naida Sleight  Dermatologist in [place]. Mother to nap frequently and allow husband to do lots of household assisstance. Mother to follow up with [facility] office again as needed. Marland Kitchen

## 2018-06-07 DIAGNOSIS — L245 Irritant contact dermatitis due to other chemical products: Secondary | ICD-10-CM | POA: Diagnosis not present

## 2018-06-21 DIAGNOSIS — J029 Acute pharyngitis, unspecified: Secondary | ICD-10-CM | POA: Diagnosis not present

## 2018-06-21 DIAGNOSIS — R509 Fever, unspecified: Secondary | ICD-10-CM | POA: Diagnosis not present

## 2018-06-25 DIAGNOSIS — N201 Calculus of ureter: Secondary | ICD-10-CM | POA: Diagnosis not present

## 2018-07-10 DIAGNOSIS — Z30431 Encounter for routine checking of intrauterine contraceptive device: Secondary | ICD-10-CM | POA: Diagnosis not present

## 2018-07-10 DIAGNOSIS — F411 Generalized anxiety disorder: Secondary | ICD-10-CM | POA: Diagnosis not present

## 2018-07-24 DIAGNOSIS — Z03818 Encounter for observation for suspected exposure to other biological agents ruled out: Secondary | ICD-10-CM | POA: Diagnosis not present

## 2018-09-11 DIAGNOSIS — R5383 Other fatigue: Secondary | ICD-10-CM | POA: Diagnosis not present

## 2018-09-11 DIAGNOSIS — Z30432 Encounter for removal of intrauterine contraceptive device: Secondary | ICD-10-CM | POA: Diagnosis not present

## 2018-10-04 DIAGNOSIS — J019 Acute sinusitis, unspecified: Secondary | ICD-10-CM | POA: Diagnosis not present

## 2018-11-19 DIAGNOSIS — D2271 Melanocytic nevi of right lower limb, including hip: Secondary | ICD-10-CM | POA: Diagnosis not present

## 2018-11-19 DIAGNOSIS — D2262 Melanocytic nevi of left upper limb, including shoulder: Secondary | ICD-10-CM | POA: Diagnosis not present

## 2018-11-19 DIAGNOSIS — D225 Melanocytic nevi of trunk: Secondary | ICD-10-CM | POA: Diagnosis not present

## 2018-11-19 DIAGNOSIS — D2261 Melanocytic nevi of right upper limb, including shoulder: Secondary | ICD-10-CM | POA: Diagnosis not present

## 2019-01-03 IMAGING — US US EXTREM LOW VENOUS*L*
1 series · 13 of 24 positions shown · non-contrast
Comparison: None.

CLINICAL DATA: Left lower extremity pain

EXAM:
LEFT LOWER EXTREMITY VENOUS DUPLEX ULTRASOUND
TECHNIQUE: Gray-scale sonography with graded compression, as well as color
Doppler and duplex ultrasound were performed to evaluate the left
lower extremity deep venous system from the level of the common
femoral vein and including the common femoral, femoral, profunda
femoral, popliteal and calf veins including the posterior tibial,
peroneal and gastrocnemius veins when visible. The superficial great
saphenous vein was also interrogated. Spectral Doppler was utilized
to evaluate flow at rest and with distal augmentation maneuvers in
the common femoral, femoral and popliteal veins.

[Series 1: us extrem low venous*left* · 0.05mm/px · 13 of 28 slices shown]
[im 1/28]
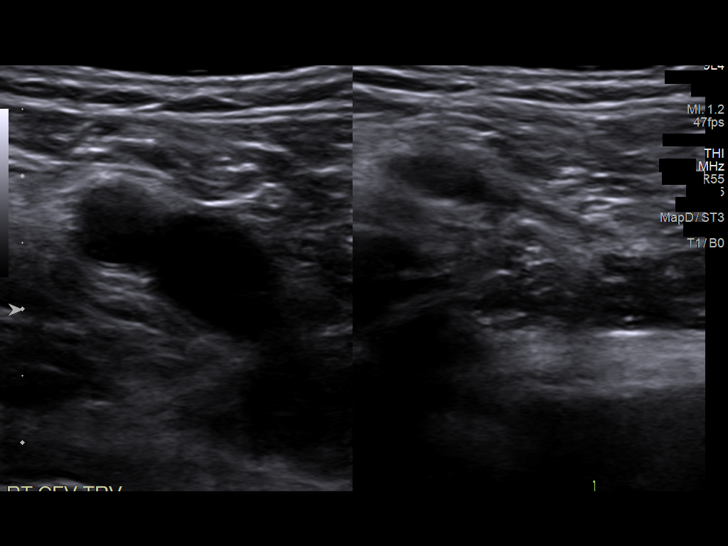
[im 3/28]
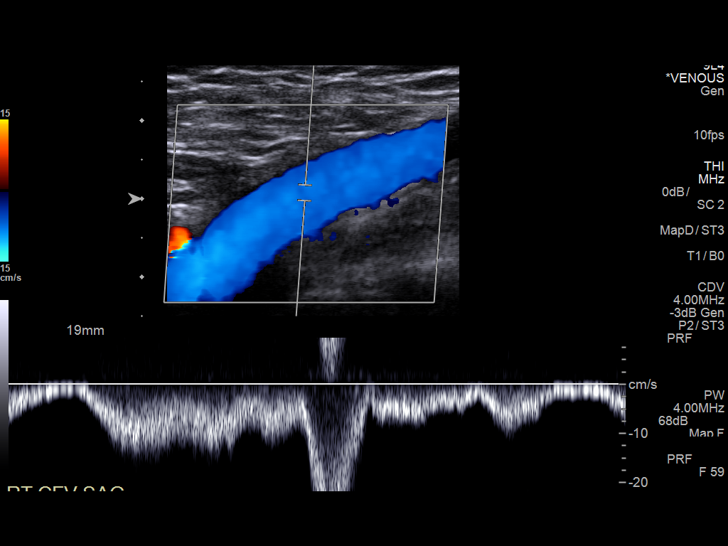
[im 5/28]
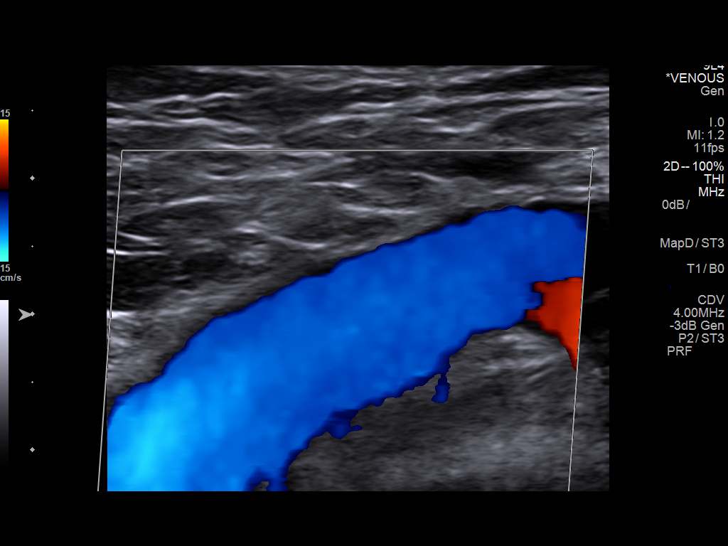
[im 8/28]
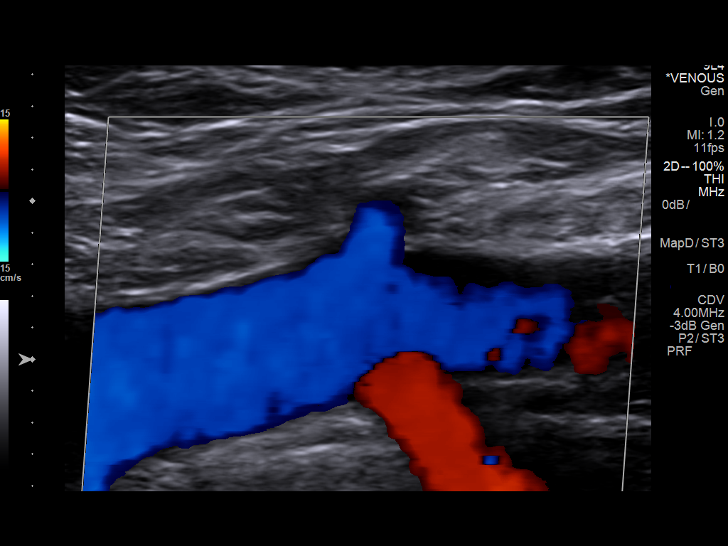
[im 10/28]
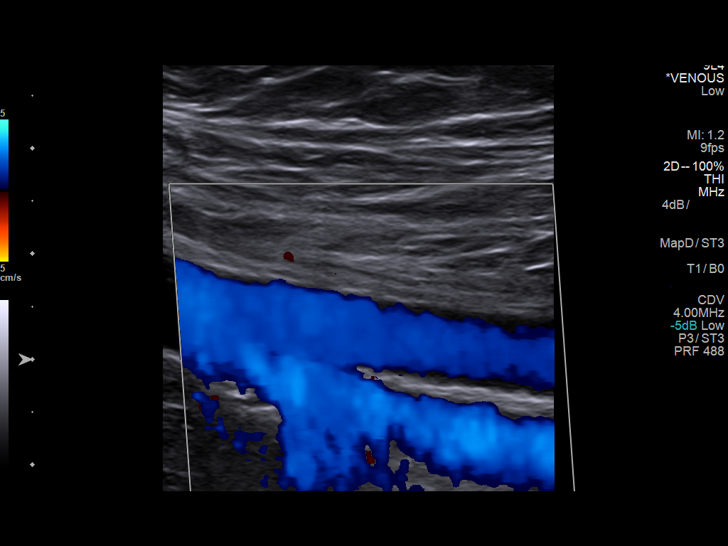
[im 12/28]
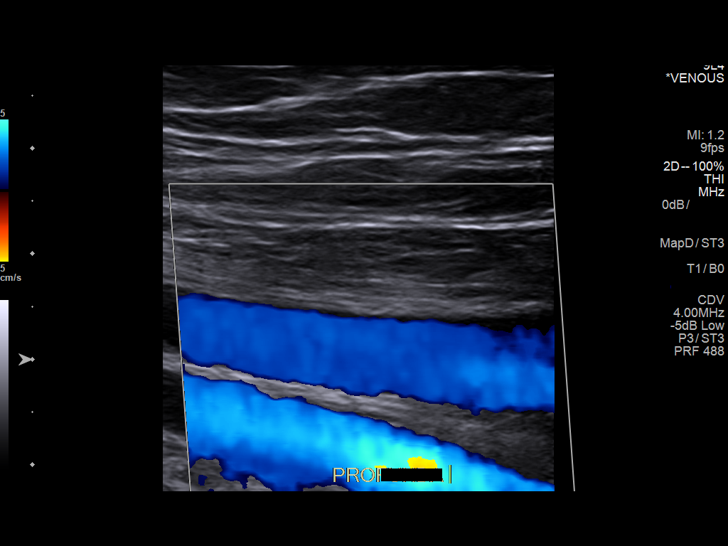
[im 15/28]
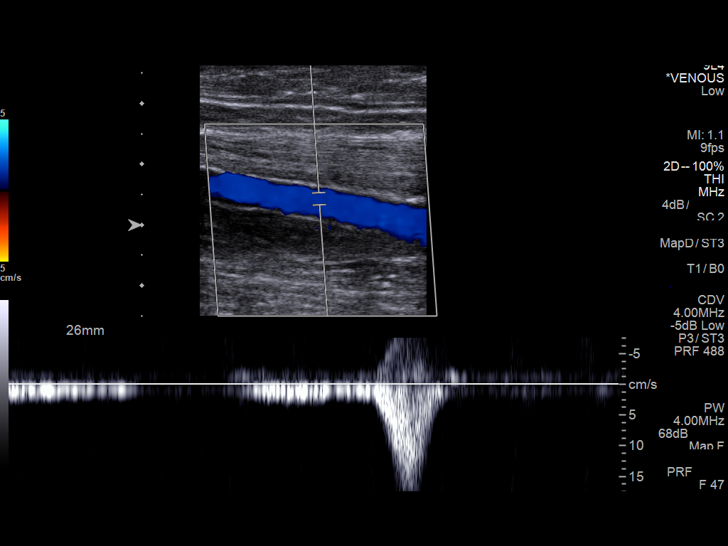
[im 16/28]
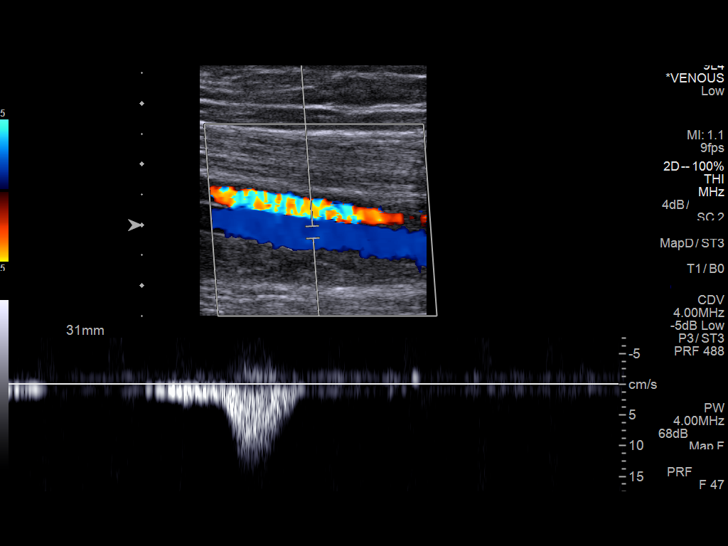
[im 18/28]
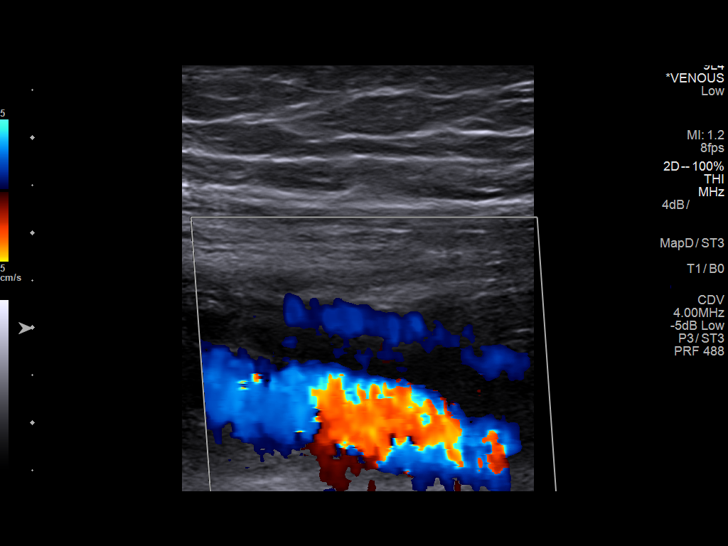
[im 20/28]
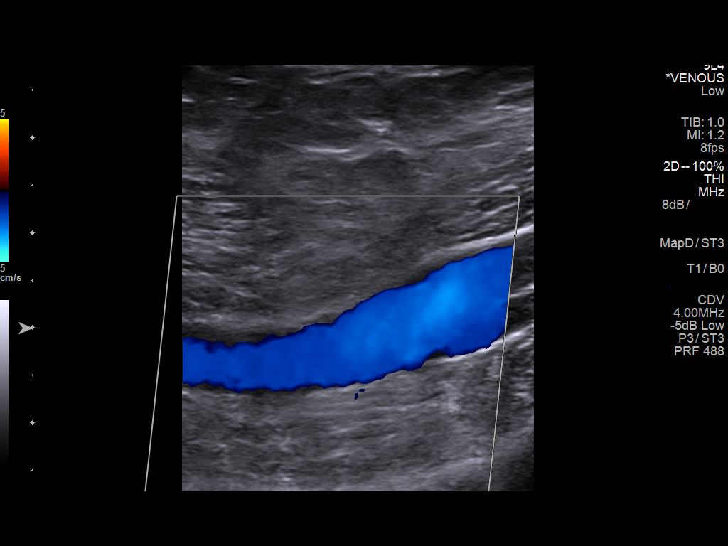
[im 23/28]
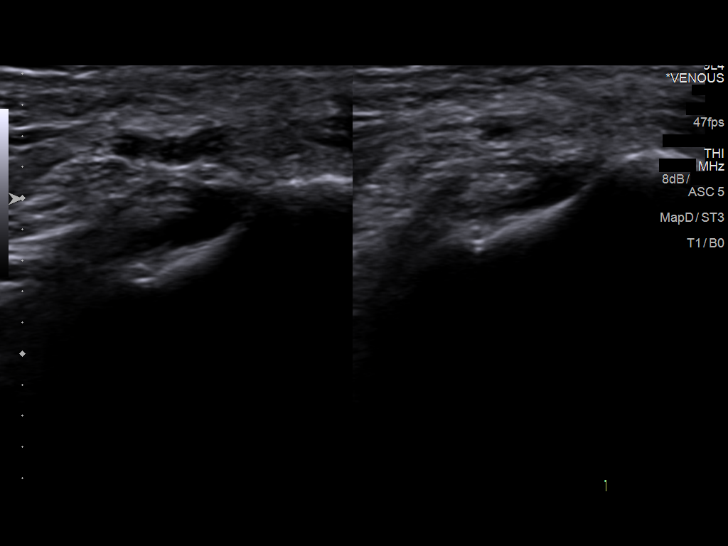
[im 25/28]
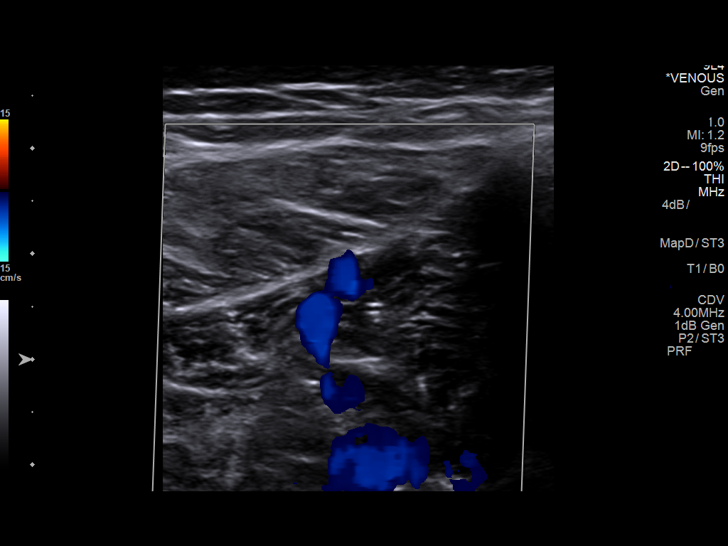
[im 28/28]
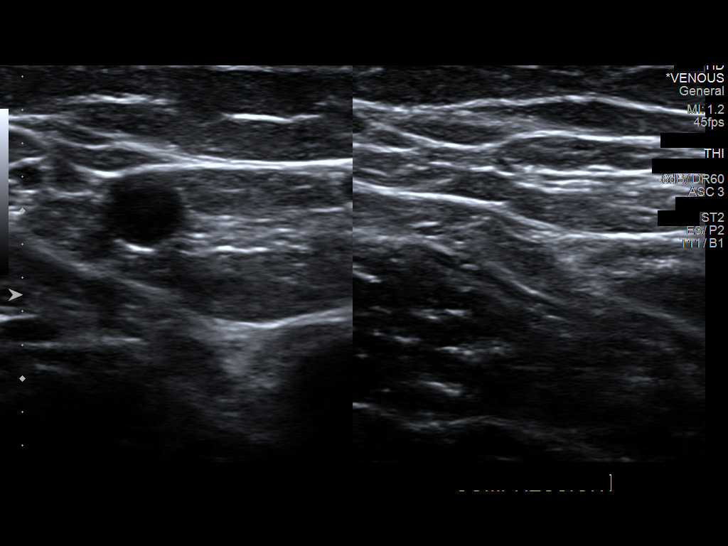

[13 of 24 positions shown; findings below may reference images not displayed]

FINDINGS: Contralateral Common Femoral Vein: Respiratory phasicity is normal
and symmetric with the symptomatic side. No evidence of thrombus.
Normal compressibility.

Common Femoral Vein: No evidence of thrombus. Normal
compressibility, respiratory phasicity and response to augmentation.

Saphenofemoral Junction: No evidence of thrombus. Normal
compressibility and flow on color Doppler imaging.

Profunda Femoral Vein: No evidence of thrombus. Normal
compressibility and flow on color Doppler imaging.

Femoral Vein: There is duplication of the left femoral vein, an
anatomic variant. No evidence of thrombus. Normal compressibility,
respiratory phasicity and response to augmentation.

Popliteal Vein: No evidence of thrombus. Normal compressibility,
respiratory phasicity and response to augmentation.

Calf Veins: No evidence of thrombus. Normal compressibility and flow
on color Doppler imaging.

Superficial Great Saphenous Vein: No evidence of thrombus. Normal
compressibility.

Venous Reflux:  None.

Other Findings:  None.
IMPRESSION: No evidence of deep venous thrombosis in the left lower extremity.
Right common femoral vein is also patent.

## 2019-01-18 DIAGNOSIS — R350 Frequency of micturition: Secondary | ICD-10-CM | POA: Diagnosis not present

## 2019-02-01 DIAGNOSIS — N3000 Acute cystitis without hematuria: Secondary | ICD-10-CM | POA: Diagnosis not present

## 2019-02-01 DIAGNOSIS — N201 Calculus of ureter: Secondary | ICD-10-CM | POA: Diagnosis not present

## 2019-04-07 DIAGNOSIS — Z20828 Contact with and (suspected) exposure to other viral communicable diseases: Secondary | ICD-10-CM | POA: Diagnosis not present

## 2019-04-10 DIAGNOSIS — Z20828 Contact with and (suspected) exposure to other viral communicable diseases: Secondary | ICD-10-CM | POA: Diagnosis not present

## 2019-05-01 DIAGNOSIS — J029 Acute pharyngitis, unspecified: Secondary | ICD-10-CM | POA: Diagnosis not present

## 2019-05-09 DIAGNOSIS — Z1389 Encounter for screening for other disorder: Secondary | ICD-10-CM | POA: Diagnosis not present

## 2019-05-09 DIAGNOSIS — Z6825 Body mass index (BMI) 25.0-25.9, adult: Secondary | ICD-10-CM | POA: Diagnosis not present

## 2019-05-09 DIAGNOSIS — R5383 Other fatigue: Secondary | ICD-10-CM | POA: Diagnosis not present

## 2019-05-09 DIAGNOSIS — Z130 Encounter for screening for diseases of the blood and blood-forming organs and certain disorders involving the immune mechanism: Secondary | ICD-10-CM | POA: Diagnosis not present

## 2019-05-09 DIAGNOSIS — Z01419 Encounter for gynecological examination (general) (routine) without abnormal findings: Secondary | ICD-10-CM | POA: Diagnosis not present

## 2019-05-09 DIAGNOSIS — R82998 Other abnormal findings in urine: Secondary | ICD-10-CM | POA: Diagnosis not present

## 2019-05-14 DIAGNOSIS — J309 Allergic rhinitis, unspecified: Secondary | ICD-10-CM | POA: Diagnosis not present

## 2019-05-14 DIAGNOSIS — R635 Abnormal weight gain: Secondary | ICD-10-CM | POA: Diagnosis not present

## 2019-05-14 DIAGNOSIS — K219 Gastro-esophageal reflux disease without esophagitis: Secondary | ICD-10-CM | POA: Diagnosis not present

## 2019-05-14 DIAGNOSIS — Z0000 Encounter for general adult medical examination without abnormal findings: Secondary | ICD-10-CM | POA: Diagnosis not present

## 2019-05-14 DIAGNOSIS — Z13220 Encounter for screening for lipoid disorders: Secondary | ICD-10-CM | POA: Diagnosis not present

## 2019-06-11 DIAGNOSIS — R5383 Other fatigue: Secondary | ICD-10-CM | POA: Diagnosis not present

## 2019-08-13 ENCOUNTER — Ambulatory Visit
Admission: RE | Admit: 2019-08-13 | Discharge: 2019-08-13 | Disposition: A | Discharge status: Home / Self Care | Payer: BLUE CROSS/BLUE SHIELD | Source: Ambulatory Visit | Attending: Gastroenterology | Admitting: Gastroenterology

## 2019-08-13 ENCOUNTER — Other Ambulatory Visit: Payer: Self-pay | Admitting: Gastroenterology

## 2019-08-13 DIAGNOSIS — K219 Gastro-esophageal reflux disease without esophagitis: Secondary | ICD-10-CM | POA: Diagnosis not present

## 2019-08-13 DIAGNOSIS — R140 Abdominal distension (gaseous): Secondary | ICD-10-CM | POA: Diagnosis not present

## 2019-08-13 DIAGNOSIS — K5909 Other constipation: Secondary | ICD-10-CM | POA: Diagnosis not present

## 2019-08-13 DIAGNOSIS — K5900 Constipation, unspecified: Secondary | ICD-10-CM

## 2019-08-28 DIAGNOSIS — H6502 Acute serous otitis media, left ear: Secondary | ICD-10-CM | POA: Diagnosis not present

## 2019-08-28 DIAGNOSIS — H9202 Otalgia, left ear: Secondary | ICD-10-CM | POA: Diagnosis not present

## 2019-08-31 DIAGNOSIS — H66002 Acute suppurative otitis media without spontaneous rupture of ear drum, left ear: Secondary | ICD-10-CM | POA: Diagnosis not present

## 2019-10-12 DIAGNOSIS — J020 Streptococcal pharyngitis: Secondary | ICD-10-CM | POA: Diagnosis not present

## 2019-10-12 DIAGNOSIS — J0140 Acute pansinusitis, unspecified: Secondary | ICD-10-CM | POA: Diagnosis not present

## 2019-10-14 DIAGNOSIS — K219 Gastro-esophageal reflux disease without esophagitis: Secondary | ICD-10-CM | POA: Diagnosis not present

## 2019-10-14 DIAGNOSIS — K5904 Chronic idiopathic constipation: Secondary | ICD-10-CM | POA: Diagnosis not present

## 2019-10-14 DIAGNOSIS — R140 Abdominal distension (gaseous): Secondary | ICD-10-CM | POA: Diagnosis not present

## 2019-10-14 DIAGNOSIS — Z8719 Personal history of other diseases of the digestive system: Secondary | ICD-10-CM | POA: Diagnosis not present

## 2019-12-05 DIAGNOSIS — D225 Melanocytic nevi of trunk: Secondary | ICD-10-CM | POA: Diagnosis not present

## 2019-12-05 DIAGNOSIS — D2261 Melanocytic nevi of right upper limb, including shoulder: Secondary | ICD-10-CM | POA: Diagnosis not present

## 2019-12-05 DIAGNOSIS — D2272 Melanocytic nevi of left lower limb, including hip: Secondary | ICD-10-CM | POA: Diagnosis not present

## 2019-12-05 DIAGNOSIS — D2271 Melanocytic nevi of right lower limb, including hip: Secondary | ICD-10-CM | POA: Diagnosis not present

## 2019-12-05 IMAGING — CT CT RENAL STONE PROTOCOL
2 of 4 series · 16 of 46 positions shown, 18 images · non-contrast
Comparison: None.

CLINICAL DATA: Right flank pain as well as pelvic pain with nausea
and vomiting. Symptoms began this morning. Suspect stone disease.

EXAM:
CT ABDOMEN AND PELVIS WITHOUT CONTRAST
TECHNIQUE: Multidetector CT imaging of the abdomen and pelvis was performed
following the standard protocol without IV contrast.

[Series 3: ap without · axial · non-contrast · 0.62mm/px · z∈[-980,-600]mm · 13 of 86 slices shown, 15 images]
[im 5/86  soft-tissue]
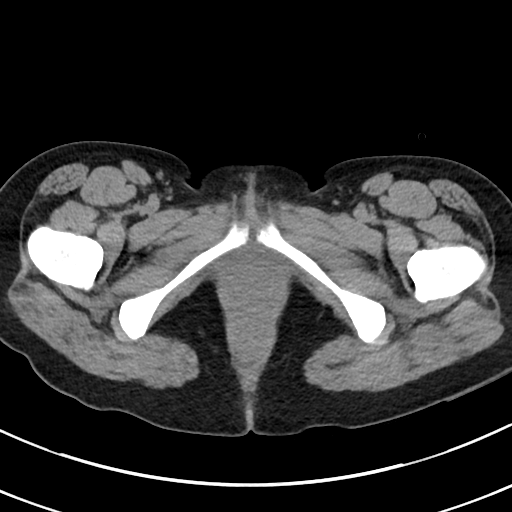
[im 5/86  bone]
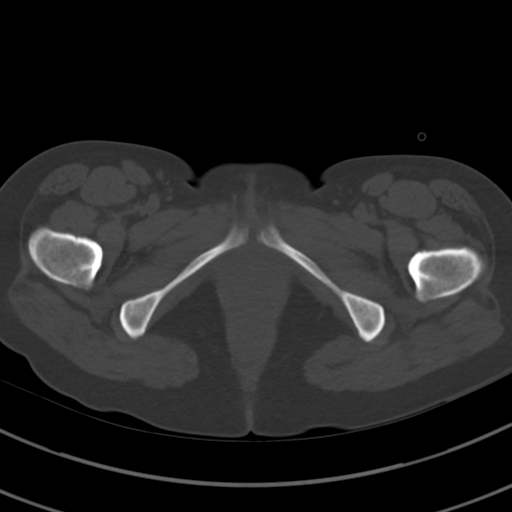
[im 14/86  soft-tissue]
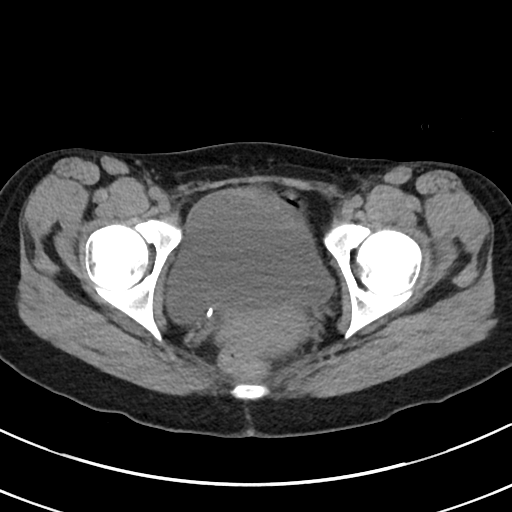
[im 18/86  soft-tissue]
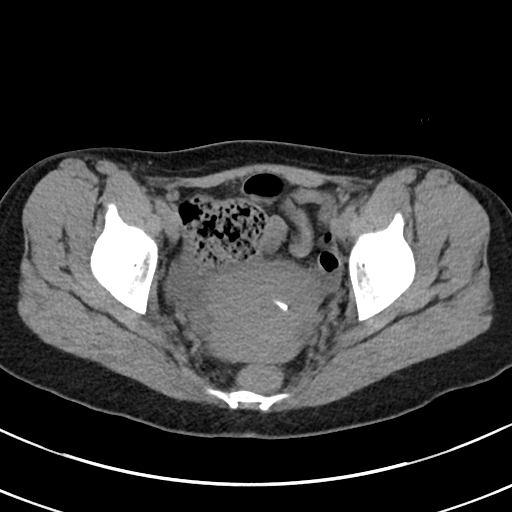
[im 23/86  soft-tissue]
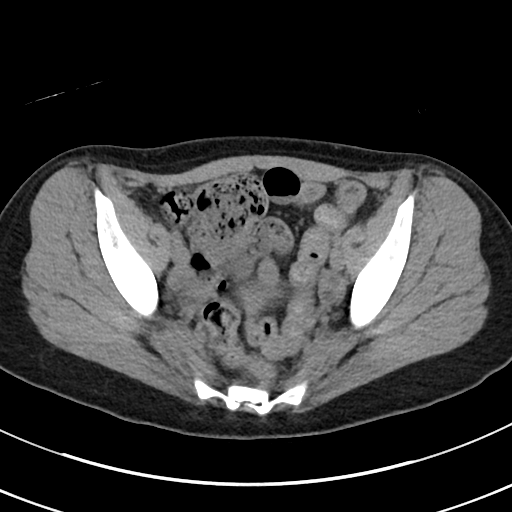
[im 32/86  soft-tissue]
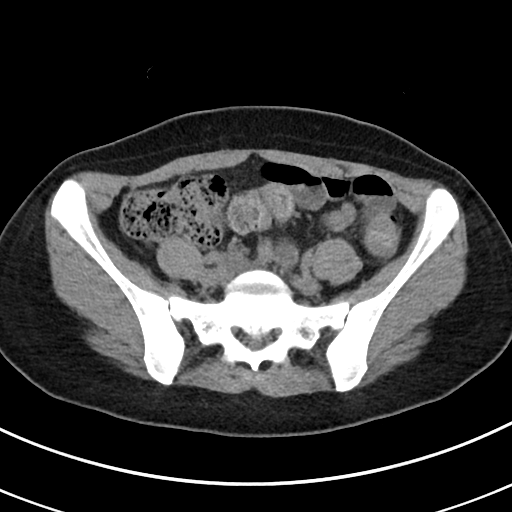
[im 36/86  soft-tissue]
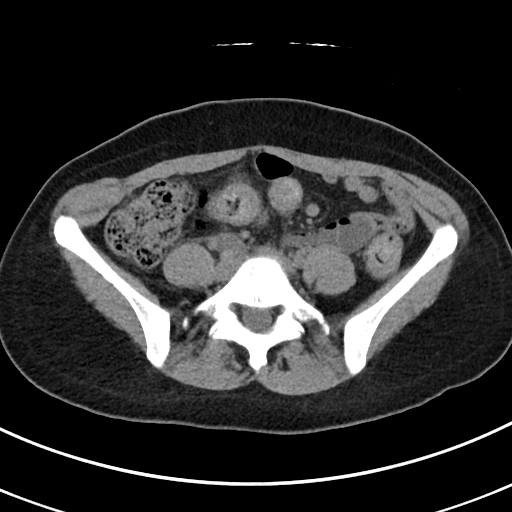
[im 45/86  soft-tissue]
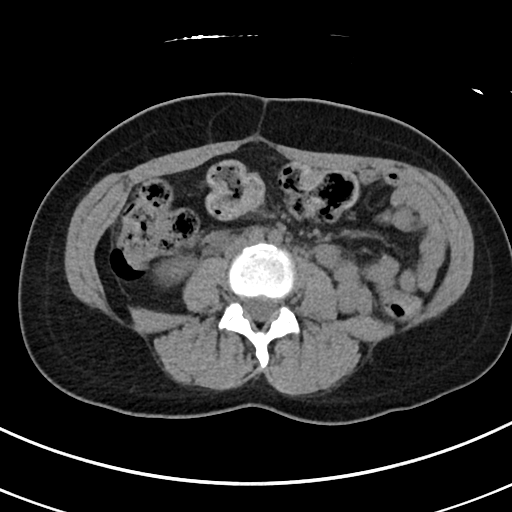
[im 50/86  soft-tissue]
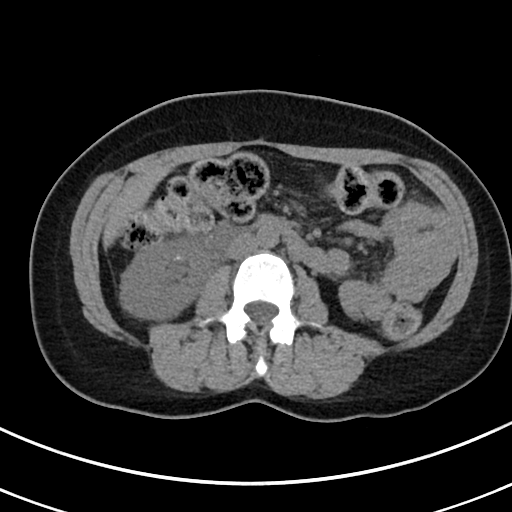
[im 54/86  soft-tissue]
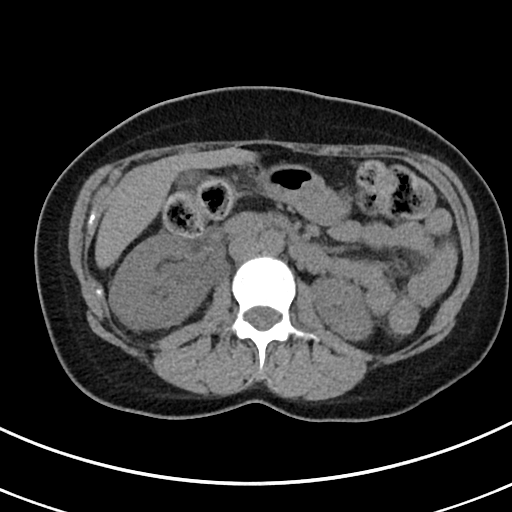
[im 54/86  bone]
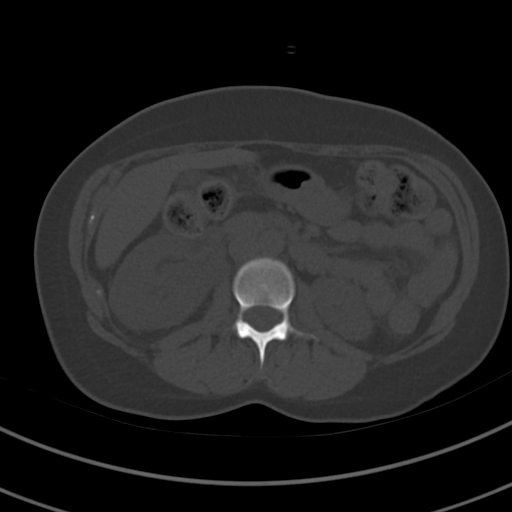
[im 63/86  soft-tissue]
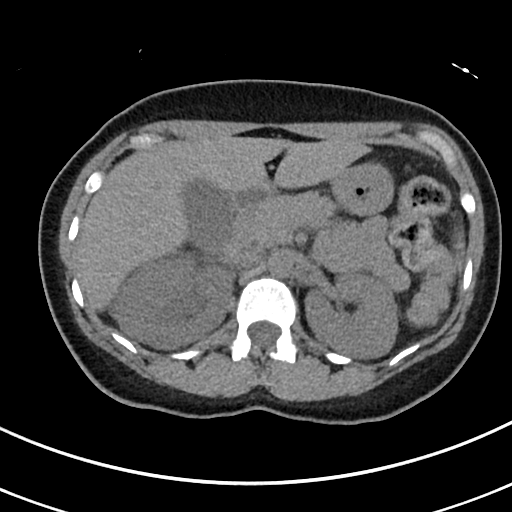
[im 68/86  soft-tissue]
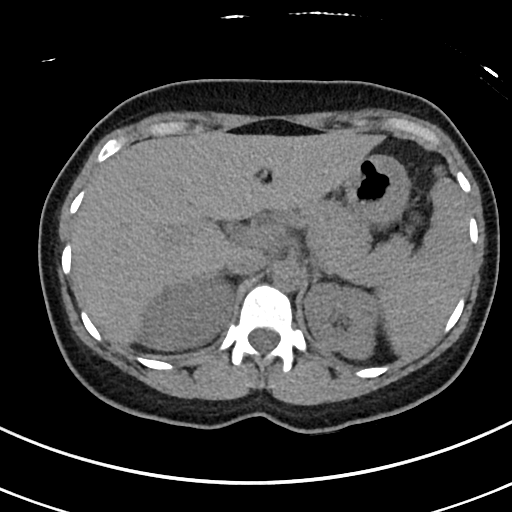
[im 72/86  soft-tissue]
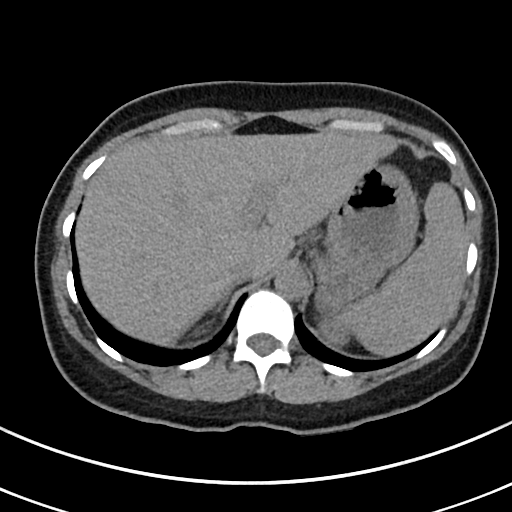
[im 81/86  soft-tissue]
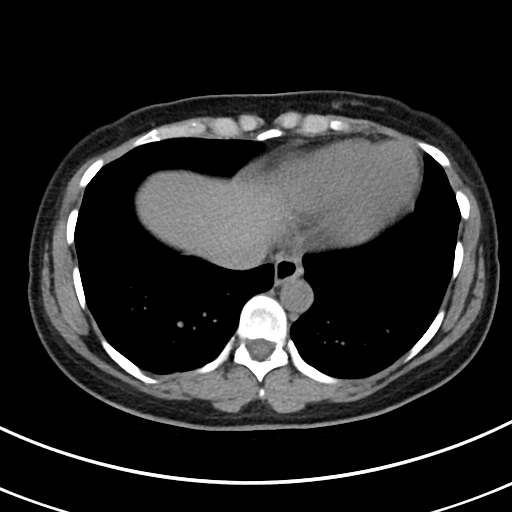

[Series 6: cor · coronal · 0.65mm/px · 3 of 66 slices shown]
[im 22/66  soft-tissue]
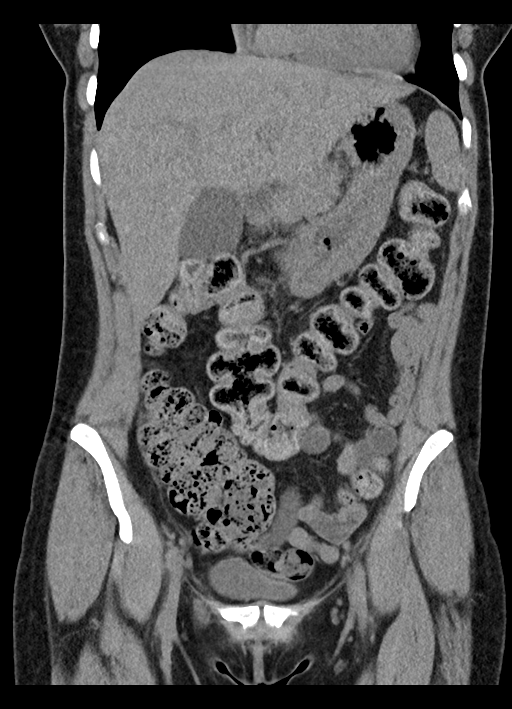
[im 29/66  soft-tissue]
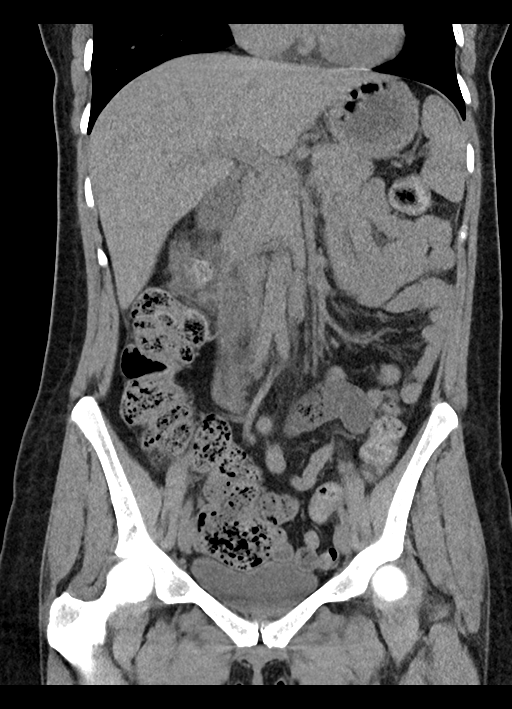
[im 37/66  soft-tissue]
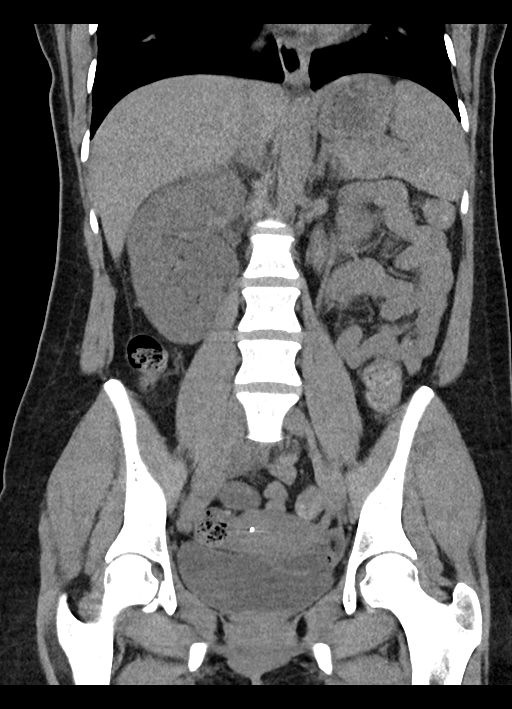

[16 of 46 positions shown; findings below may reference images not displayed]

FINDINGS: Lower chest: Lung bases are clear.

Hepatobiliary: Liver, gallbladder and biliary tree are normal.

Pancreas: Normal.

Spleen: Normal.

Adrenals/Urinary Tract: Adrenal glands are normal. Kidneys normal
size. There are a few small bilateral punctate renal stones. Largest
stone 3-4 mm over the lower pole right kidney. There is mild
right-sided hydronephrosis. Dilatation of the right ureter as there
is a 5 mm stone at the right UVJ causing this obstruction. Left
ureter is normal. Bladder is unremarkable.

Stomach/Bowel: Stomach and small bowel are normal. Appendix is
normal. Colon is normal.

Vascular/Lymphatic: Normal.

Reproductive: IUD is superficially located over the uterine fundus
and demonstrates steep oblique orientation close to horizontal
orientation. Ovaries within normal.

Other: None.

Musculoskeletal: Within normal.
IMPRESSION: Bilateral nephrolithiasis. 5 mm stone at the right UVJ causing
low-grade obstruction.

Superficial abnormally oriented IUD over the uterine fundus as
question a degree of myometrial migration.

## 2019-12-23 DIAGNOSIS — H66003 Acute suppurative otitis media without spontaneous rupture of ear drum, bilateral: Secondary | ICD-10-CM | POA: Diagnosis not present

## 2020-02-18 DIAGNOSIS — R300 Dysuria: Secondary | ICD-10-CM | POA: Diagnosis not present

## 2020-03-21 DIAGNOSIS — J029 Acute pharyngitis, unspecified: Secondary | ICD-10-CM | POA: Diagnosis not present

## 2020-03-21 DIAGNOSIS — Z20822 Contact with and (suspected) exposure to covid-19: Secondary | ICD-10-CM | POA: Diagnosis not present

## 2020-03-21 DIAGNOSIS — R52 Pain, unspecified: Secondary | ICD-10-CM | POA: Diagnosis not present

## 2020-03-21 DIAGNOSIS — R509 Fever, unspecified: Secondary | ICD-10-CM | POA: Diagnosis not present

## 2020-03-21 DIAGNOSIS — Z03818 Encounter for observation for suspected exposure to other biological agents ruled out: Secondary | ICD-10-CM | POA: Diagnosis not present

## 2020-03-21 DIAGNOSIS — U071 COVID-19: Secondary | ICD-10-CM | POA: Diagnosis not present

## 2020-05-07 DIAGNOSIS — N3289 Other specified disorders of bladder: Secondary | ICD-10-CM | POA: Diagnosis not present

## 2020-05-13 DIAGNOSIS — Z01419 Encounter for gynecological examination (general) (routine) without abnormal findings: Secondary | ICD-10-CM | POA: Diagnosis not present

## 2020-05-13 DIAGNOSIS — Z124 Encounter for screening for malignant neoplasm of cervix: Secondary | ICD-10-CM | POA: Diagnosis not present

## 2020-05-13 DIAGNOSIS — R102 Pelvic and perineal pain: Secondary | ICD-10-CM | POA: Diagnosis not present

## 2020-05-13 DIAGNOSIS — Z1151 Encounter for screening for human papillomavirus (HPV): Secondary | ICD-10-CM | POA: Diagnosis not present

## 2020-05-13 DIAGNOSIS — Z130 Encounter for screening for diseases of the blood and blood-forming organs and certain disorders involving the immune mechanism: Secondary | ICD-10-CM | POA: Diagnosis not present

## 2020-05-31 DIAGNOSIS — J029 Acute pharyngitis, unspecified: Secondary | ICD-10-CM | POA: Diagnosis not present

## 2020-05-31 DIAGNOSIS — M791 Myalgia, unspecified site: Secondary | ICD-10-CM | POA: Diagnosis not present

## 2020-05-31 DIAGNOSIS — J069 Acute upper respiratory infection, unspecified: Secondary | ICD-10-CM | POA: Diagnosis not present

## 2020-10-05 DIAGNOSIS — H6990 Unspecified Eustachian tube disorder, unspecified ear: Secondary | ICD-10-CM | POA: Diagnosis not present

## 2020-10-05 DIAGNOSIS — H6503 Acute serous otitis media, bilateral: Secondary | ICD-10-CM | POA: Diagnosis not present

## 2020-11-26 DIAGNOSIS — J309 Allergic rhinitis, unspecified: Secondary | ICD-10-CM | POA: Diagnosis not present

## 2020-11-26 DIAGNOSIS — H93293 Other abnormal auditory perceptions, bilateral: Secondary | ICD-10-CM | POA: Diagnosis not present

## 2020-11-26 DIAGNOSIS — H6983 Other specified disorders of Eustachian tube, bilateral: Secondary | ICD-10-CM | POA: Diagnosis not present

## 2020-12-02 DIAGNOSIS — B360 Pityriasis versicolor: Secondary | ICD-10-CM | POA: Diagnosis not present

## 2020-12-02 DIAGNOSIS — D2261 Melanocytic nevi of right upper limb, including shoulder: Secondary | ICD-10-CM | POA: Diagnosis not present

## 2020-12-02 DIAGNOSIS — D2262 Melanocytic nevi of left upper limb, including shoulder: Secondary | ICD-10-CM | POA: Diagnosis not present

## 2020-12-02 DIAGNOSIS — D225 Melanocytic nevi of trunk: Secondary | ICD-10-CM | POA: Diagnosis not present

## 2020-12-26 ENCOUNTER — Ambulatory Visit
Admission: EM | Admit: 2020-12-26 | Discharge: 2020-12-26 | Disposition: A | Discharge status: Home / Self Care | Payer: BC Managed Care – PPO | Attending: Physician Assistant | Admitting: Physician Assistant

## 2020-12-26 ENCOUNTER — Other Ambulatory Visit: Payer: Self-pay

## 2020-12-26 DIAGNOSIS — J101 Influenza due to other identified influenza virus with other respiratory manifestations: Secondary | ICD-10-CM | POA: Diagnosis not present

## 2020-12-26 LAB — POCT INFLUENZA A/B
Influenza A, POC: POSITIVE — AB
Influenza B, POC: NEGATIVE

## 2020-12-26 LAB — POCT RAPID STREP A (OFFICE): Rapid Strep A Screen: NEGATIVE

## 2020-12-26 MED ORDER — OSELTAMIVIR PHOSPHATE 75 MG PO CAPS: 75.0000 mg | ORAL_CAPSULE | Freq: Two times a day (BID) | ORAL | 0 refills | Status: AC

## 2020-12-26 NOTE — ED Provider Notes (Signed)
[facility]    CSN: [id number] Arrival date & time: 12/26/20  1033      History   Chief Complaint Chief Complaint  Patient presents with   Generalized Body Aches   Cough    HPI Taylor Payne is a 40 y.o. female.   Patient here today for evaluation of nasal congestion and drainage, sore throat, cough and overall fatigue.  She has also had some fever.  She states fever started last night early this morning.  She has not had vomiting or diarrhea.  Her children have tested positive for strep recently.    The history is provided by the patient.  Cough Associated symptoms: fever and sore throat   Associated symptoms: no ear pain, no eye discharge, no shortness of breath and no wheezing    Past Medical History:  Diagnosis Date   GERD (gastroesophageal reflux disease)    Hx of varicella    IBS (irritable bowel syndrome)     Patient Active Problem List   Diagnosis Date Noted   Indication for care in labor or delivery 03/06/2018   Normal pregnancy, first 09/18/2014   SVD (spontaneous vaginal delivery) 09/18/2014    History reviewed. No pertinent surgical history.  OB History     Gravida  2   Para  2   Term  2   Preterm      AB      Living  2      SAB      IAB      Ectopic      Multiple  0   Live Births  2            Home Medications    Prior to Admission medications   Medication Sig Start Date End Date Taking? Authorizing Provider  oseltamivir (TAMIFLU) 75 MG capsule Take 1 capsule (75 mg total) by mouth every 12 (twelve) hours. 12/26/20  Yes Tomi Bamberger, PA-C  cephALEXin (KEFLEX) 500 MG capsule Take 1 capsule (500 mg total) by mouth 4 (four) times daily. 05/19/18   Jacalyn Lefevre, MD  docusate sodium (COLACE) 100 MG capsule Take 200 mg by mouth daily as needed for mild constipation.    [provider]  ibuprofen (ADVIL,MOTRIN) 600 MG tablet Take 1 tablet (600 mg total) by mouth every 6 (six) hours as needed.  05/19/18   Jacalyn Lefevre, MD  levocetirizine (XYZAL) 5 MG tablet Take 2.5 mg by mouth at bedtime. 12/24/15   [provider]  mometasone (NASONEX) 50 MCG/ACT nasal spray Place 1 spray into the nose daily. 12/26/15   [provider]  ondansetron (ZOFRAN ODT) 4 MG disintegrating tablet Take 1 tablet (4 mg total) by mouth every 8 (eight) hours as needed. 05/19/18   Jacalyn Lefevre, MD  oxyCODONE-acetaminophen (PERCOCET/ROXICET) 5-325 MG tablet Take 1 tablet by mouth every 4 (four) hours as needed for severe pain. 05/19/18   Jacalyn Lefevre, MD  pantoprazole (PROTONIX) 40 MG tablet Take 40 mg by mouth at bedtime.    [provider]  Prenatal Vit-Fe Fumarate-FA (PRENATAL MULTIVITAMIN) TABS tablet Take 1 tablet by mouth daily at 12 noon. 03/08/18   Bovard-Stuckert, Augusto Gamble, MD  tamsulosin (FLOMAX) 0.4 MG CAPS capsule Take 1 capsule (0.4 mg total) by mouth daily. 05/19/18   Jacalyn Lefevre, MD    Family History History reviewed. No pertinent family history.  Social History Social History   Tobacco Use   Smoking status: Never   Smokeless tobacco: Never  Substance  Use Topics   Alcohol use: Yes   Drug use: No     Allergies   Penicillins and Ocuflox [ofloxacin]   Review of Systems Review of Systems  Constitutional:  Positive for fatigue and fever.  HENT:  Positive for congestion, sinus pressure and sore throat. Negative for ear pain.   Eyes:  Negative for discharge and redness.  Respiratory:  Positive for cough. Negative for shortness of breath and wheezing.   Gastrointestinal:  Negative for abdominal pain, diarrhea, nausea and vomiting.    Physical Exam Triage Vital Signs ED Triage Vitals  Enc Vitals Group     BP 12/26/20 1121 117/78     Pulse Rate 12/26/20 1121 (!) 133     Resp 12/26/20 1121 20     Temp 12/26/20 1121 99.1 F (37.3 C)     Temp Source 12/26/20 1121 Oral     SpO2 12/26/20 1121 94 %     Weight --      Height --      Head Circumference --       Peak Flow --      Pain Score 12/26/20 1119 8     Pain Loc --      Pain Edu? --      Excl. in GC? --    No data found.  Updated Vital Signs BP 117/78 (BP Location: Left Arm)   Pulse (!) 133   Temp 99.1 F (37.3 C) (Oral)   Resp 20   LMP 12/17/2020   SpO2 94%      Physical Exam Vitals and nursing note reviewed.  Constitutional:      General: She is not in acute distress.    Appearance: Normal appearance. She is not ill-appearing.  HENT:     Head: Normocephalic and atraumatic.     Right Ear: Tympanic membrane normal.     Left Ear: Tympanic membrane normal.     Nose: Congestion present.     Mouth/Throat:     Mouth: Mucous membranes are moist.     Pharynx: No oropharyngeal exudate or posterior oropharyngeal erythema.  Eyes:     Conjunctiva/sclera: Conjunctivae normal.  Cardiovascular:     Rate and Rhythm: Normal rate and regular rhythm.     Heart sounds: Normal heart sounds. No murmur heard. Pulmonary:     Effort: Pulmonary effort is normal. No respiratory distress.     Breath sounds: Normal breath sounds. No wheezing, rhonchi or rales.  Skin:    General: Skin is warm and dry.  Neurological:     Mental Status: She is alert.  Psychiatric:        Mood and Affect: Mood normal.        Thought Content: Thought content normal.     UC Treatments / Results  Labs (all labs ordered are listed, but only abnormal results are displayed) Labs Reviewed  POCT INFLUENZA A/B - Abnormal; Notable for the following components:      Result Value   Influenza A, POC Positive (*)    All other components within normal limits  CULTURE, GROUP A STREP [facility])  POCT RAPID STREP A (OFFICE)    EKG   Radiology No results found.  Procedures Procedures (including critical care time)  Medications Ordered in UC Medications - No data to display  Initial Impression / Assessment and Plan / UC Course  I have reviewed the triage vital signs and the nursing notes.  Pertinent labs &  imaging results that were available during  my care of the patient were reviewed by me and considered in my medical decision making (see chart for details).  Rapid strep test negative, will order throat culture.  Rapid flu test positive.  Will prescribe Tamiflu for hopeful improvement.  Encouraged symptomatic treatment otherwise.  Recommend follow-up if symptoms fail to improve or worsen anyway.  Final Clinical Impressions(s) / UC Diagnoses   Final diagnoses:  Influenza A   Discharge Instructions   None    ED Prescriptions     Medication Sig Dispense Auth. Provider   oseltamivir (TAMIFLU) 75 MG capsule Take 1 capsule (75 mg total) by mouth every 12 (twelve) hours. 10 capsule Tomi Bamberger, PA-C      PDMP not reviewed this encounter.   Tomi Bamberger, PA-C 12/26/20 1159

## 2020-12-26 NOTE — ED Triage Notes (Signed)
Pt present coughing with body aches and fever. Pt states that some of the symptoms started earlier this week and the fever started this am. Pt did an at home covid test and it was negative. Pt state her sons tested positive for step.

## 2021-02-28 IMAGING — CR DG ABDOMEN 2V
2 series · 2 of 2 positions shown · non-contrast
Comparison: 06/25/2018

CLINICAL DATA: Constipation, constipation for 2 months with
bloating

EXAM:
ABDOMEN - 2 VIEW

[w abdomen upright *]
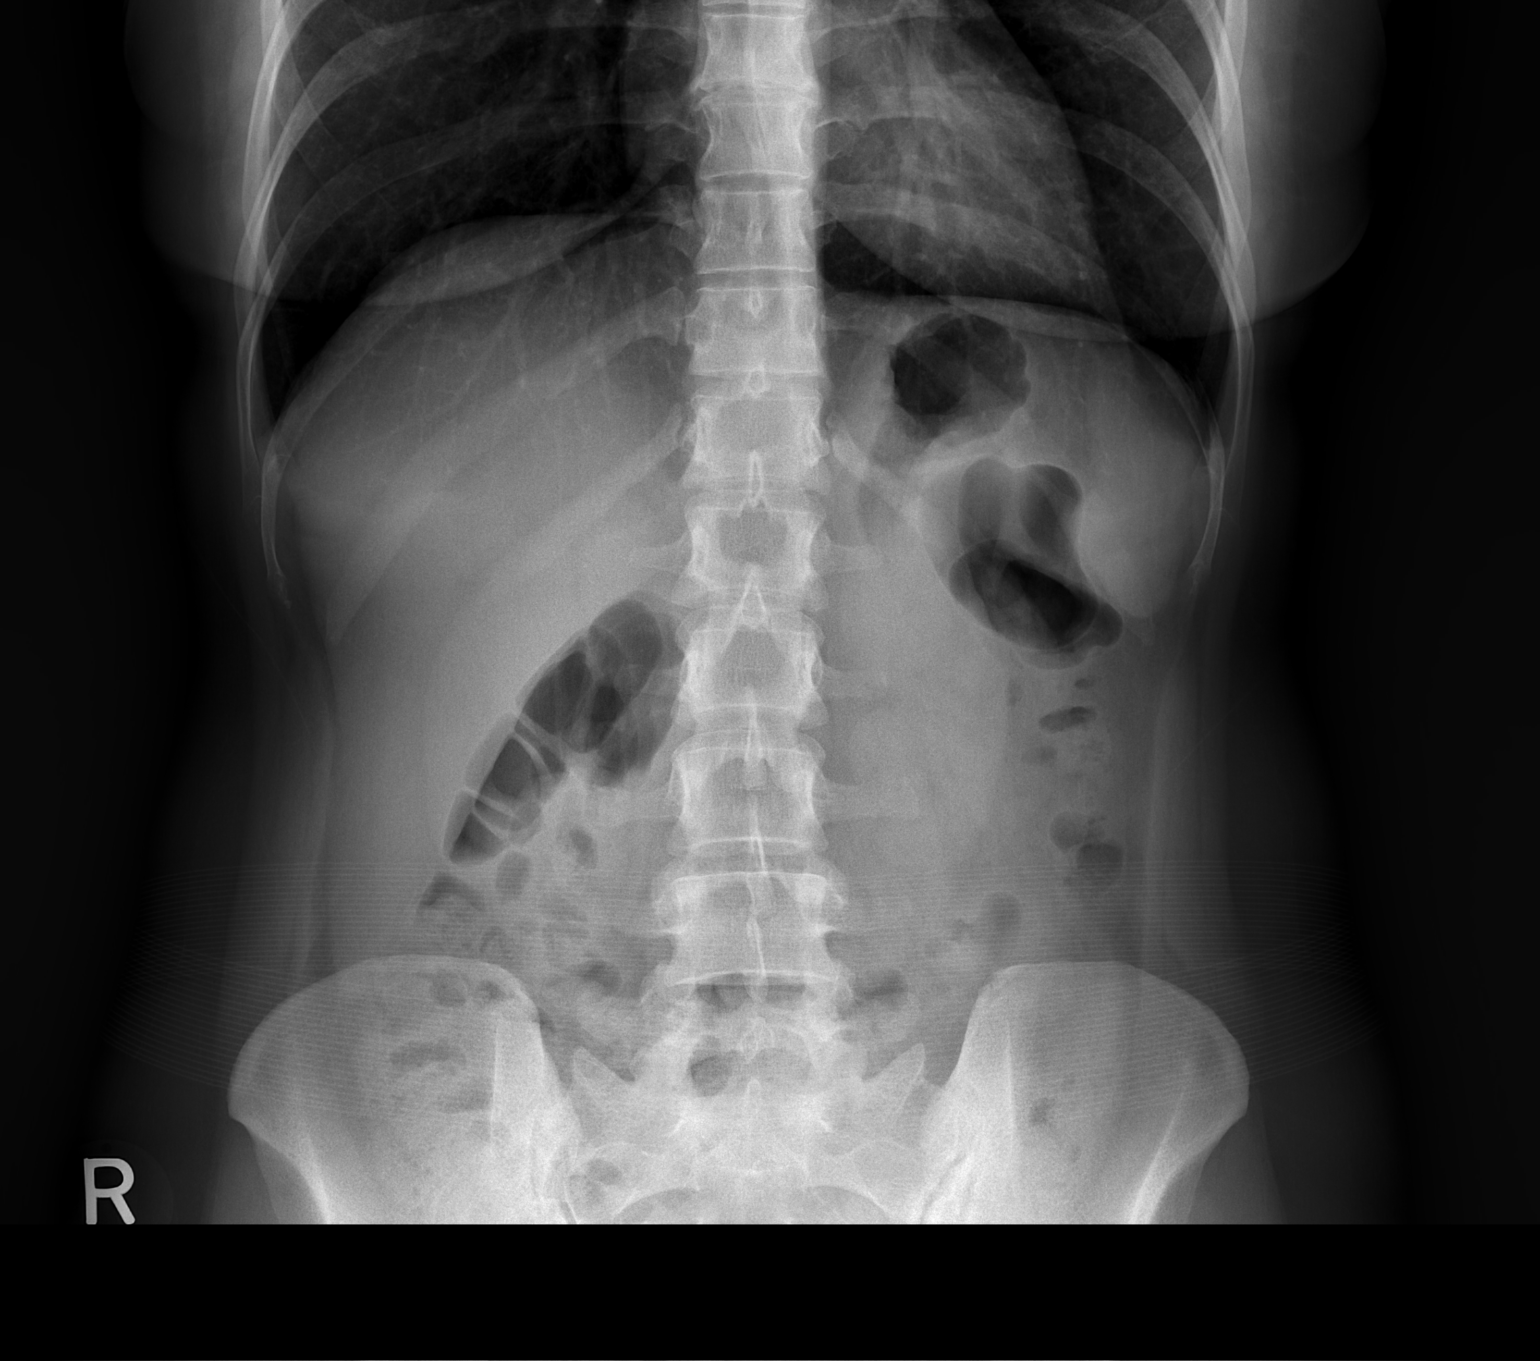

[t abdomen supine]
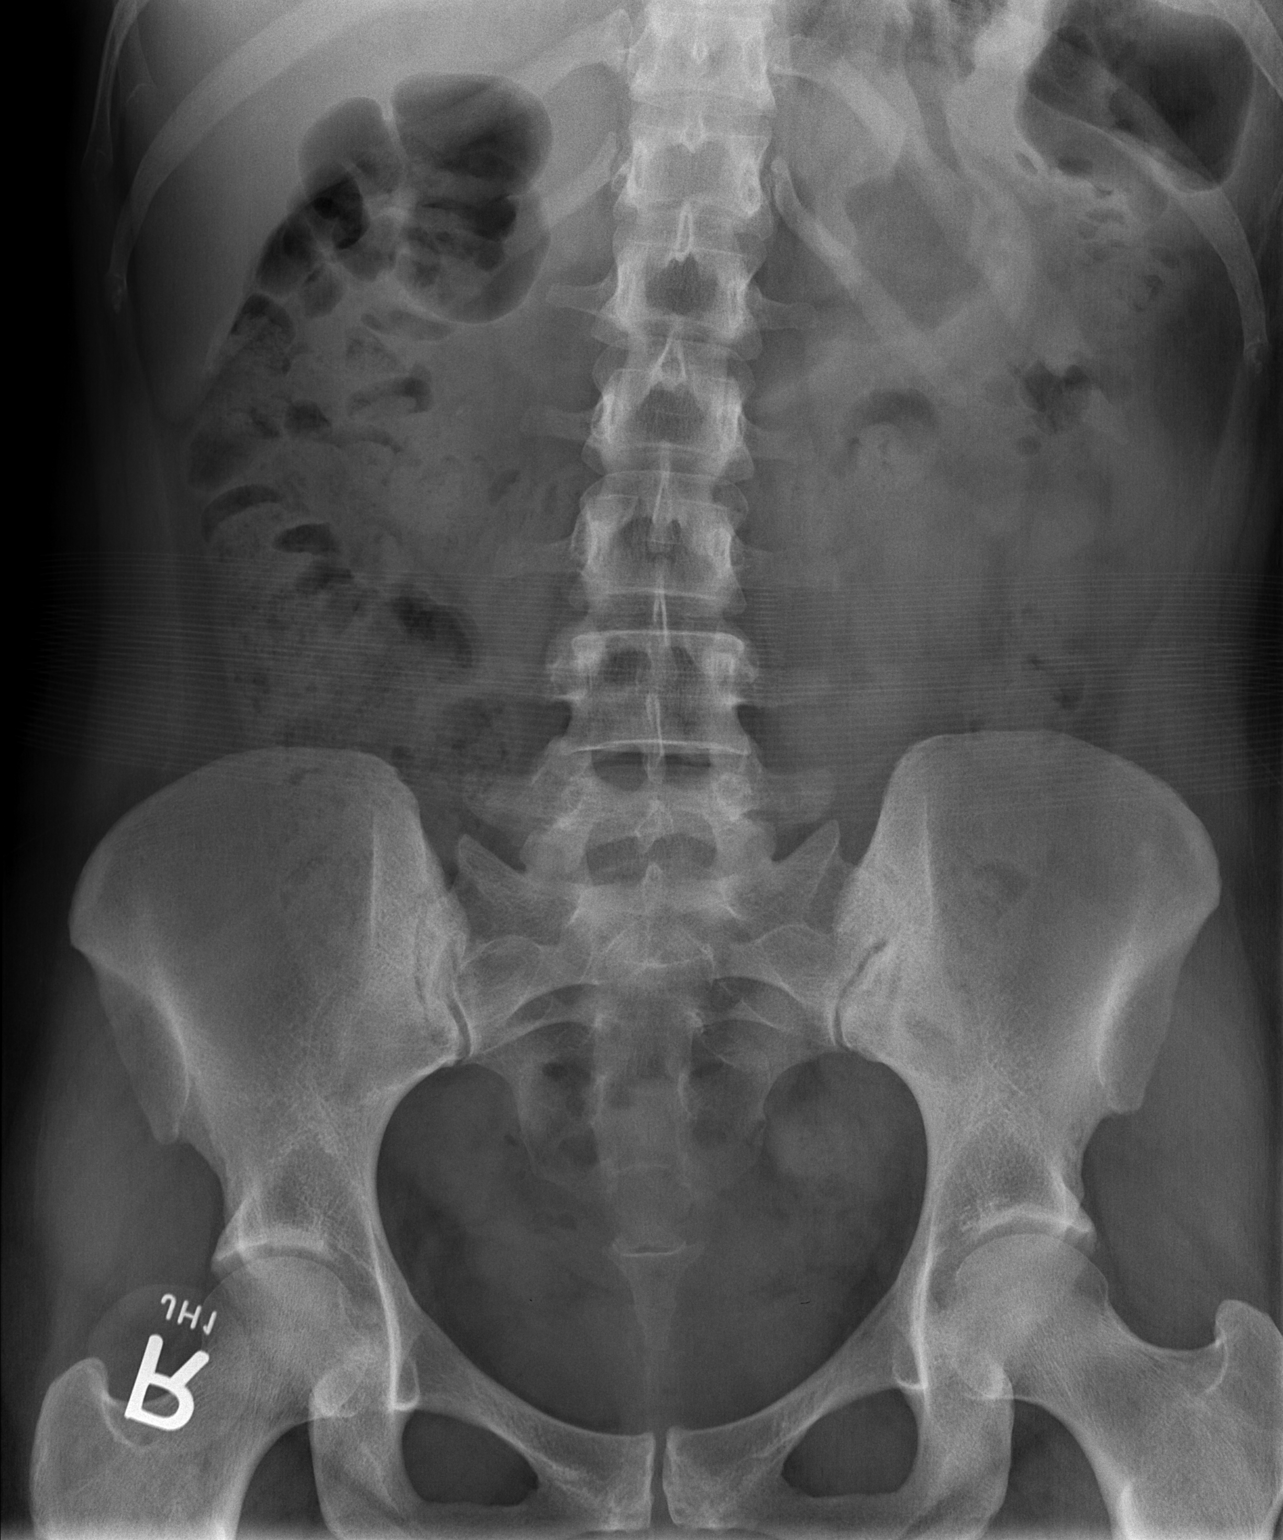

[2 of 2 positions shown; findings below may reference images not displayed]

FINDINGS: Lung bases are clear.

No free air beneath either RIGHT or LEFT hemidiaphragm.

Moderate amount of stool in the ascending and transverse colon mixed
with gas, no colonic distension.

Stool also in descending colon and rectum.

No abnormal calcifications are signs of organomegaly. Visualized
skeletal structures on limited assessment are unremarkable.
IMPRESSION: Moderate amount of stool in the ascending and transverse colon. No
signs of obstruction or free air.

## 2021-03-18 DIAGNOSIS — K5904 Chronic idiopathic constipation: Secondary | ICD-10-CM | POA: Diagnosis not present

## 2021-03-18 DIAGNOSIS — J309 Allergic rhinitis, unspecified: Secondary | ICD-10-CM | POA: Diagnosis not present

## 2021-03-18 DIAGNOSIS — Z131 Encounter for screening for diabetes mellitus: Secondary | ICD-10-CM | POA: Diagnosis not present

## 2021-03-18 DIAGNOSIS — Z0000 Encounter for general adult medical examination without abnormal findings: Secondary | ICD-10-CM | POA: Diagnosis not present

## 2021-03-18 DIAGNOSIS — K219 Gastro-esophageal reflux disease without esophagitis: Secondary | ICD-10-CM | POA: Diagnosis not present

## 2021-03-18 DIAGNOSIS — Z13220 Encounter for screening for lipoid disorders: Secondary | ICD-10-CM | POA: Diagnosis not present

## 2021-05-28 DIAGNOSIS — J019 Acute sinusitis, unspecified: Secondary | ICD-10-CM | POA: Diagnosis not present

## 2021-11-04 DIAGNOSIS — Z01419 Encounter for gynecological examination (general) (routine) without abnormal findings: Secondary | ICD-10-CM | POA: Diagnosis not present

## 2021-11-04 DIAGNOSIS — Z130 Encounter for screening for diseases of the blood and blood-forming organs and certain disorders involving the immune mechanism: Secondary | ICD-10-CM | POA: Diagnosis not present

## 2021-11-04 DIAGNOSIS — N921 Excessive and frequent menstruation with irregular cycle: Secondary | ICD-10-CM | POA: Diagnosis not present

## 2021-11-04 DIAGNOSIS — Z1389 Encounter for screening for other disorder: Secondary | ICD-10-CM | POA: Diagnosis not present

## 2021-11-04 DIAGNOSIS — Z6825 Body mass index (BMI) 25.0-25.9, adult: Secondary | ICD-10-CM | POA: Diagnosis not present

## 2022-01-01 DIAGNOSIS — R059 Cough, unspecified: Secondary | ICD-10-CM | POA: Diagnosis not present

## 2022-01-01 DIAGNOSIS — Z20822 Contact with and (suspected) exposure to covid-19: Secondary | ICD-10-CM | POA: Diagnosis not present

## 2022-01-01 DIAGNOSIS — J0100 Acute maxillary sinusitis, unspecified: Secondary | ICD-10-CM | POA: Diagnosis not present

## 2022-01-03 DIAGNOSIS — D2261 Melanocytic nevi of right upper limb, including shoulder: Secondary | ICD-10-CM | POA: Diagnosis not present

## 2022-01-03 DIAGNOSIS — D2262 Melanocytic nevi of left upper limb, including shoulder: Secondary | ICD-10-CM | POA: Diagnosis not present

## 2022-01-03 DIAGNOSIS — D225 Melanocytic nevi of trunk: Secondary | ICD-10-CM | POA: Diagnosis not present

## 2022-01-03 DIAGNOSIS — B360 Pityriasis versicolor: Secondary | ICD-10-CM | POA: Diagnosis not present

## 2022-01-03 DIAGNOSIS — D1801 Hemangioma of skin and subcutaneous tissue: Secondary | ICD-10-CM | POA: Diagnosis not present

## 2022-03-21 DIAGNOSIS — Z0000 Encounter for general adult medical examination without abnormal findings: Secondary | ICD-10-CM | POA: Diagnosis not present

## 2022-05-02 DIAGNOSIS — U071 COVID-19: Secondary | ICD-10-CM | POA: Diagnosis not present

## 2022-05-02 DIAGNOSIS — R059 Cough, unspecified: Secondary | ICD-10-CM | POA: Diagnosis not present

## 2022-05-02 DIAGNOSIS — R0981 Nasal congestion: Secondary | ICD-10-CM | POA: Diagnosis not present

## 2022-07-17 DIAGNOSIS — J0100 Acute maxillary sinusitis, unspecified: Secondary | ICD-10-CM | POA: Diagnosis not present

## 2023-02-06 DIAGNOSIS — D485 Neoplasm of uncertain behavior of skin: Secondary | ICD-10-CM | POA: Diagnosis not present

## 2023-02-06 DIAGNOSIS — B360 Pityriasis versicolor: Secondary | ICD-10-CM | POA: Diagnosis not present

## 2023-02-06 DIAGNOSIS — D225 Melanocytic nevi of trunk: Secondary | ICD-10-CM | POA: Diagnosis not present

## 2023-02-06 DIAGNOSIS — L821 Other seborrheic keratosis: Secondary | ICD-10-CM | POA: Diagnosis not present

## 2023-02-06 DIAGNOSIS — D2262 Melanocytic nevi of left upper limb, including shoulder: Secondary | ICD-10-CM | POA: Diagnosis not present

## 2023-02-06 DIAGNOSIS — D2272 Melanocytic nevi of left lower limb, including hip: Secondary | ICD-10-CM | POA: Diagnosis not present

## 2023-03-14 DIAGNOSIS — Z130 Encounter for screening for diseases of the blood and blood-forming organs and certain disorders involving the immune mechanism: Secondary | ICD-10-CM | POA: Diagnosis not present

## 2023-03-14 DIAGNOSIS — Z01419 Encounter for gynecological examination (general) (routine) without abnormal findings: Secondary | ICD-10-CM | POA: Diagnosis not present

## 2023-03-14 DIAGNOSIS — Z1231 Encounter for screening mammogram for malignant neoplasm of breast: Secondary | ICD-10-CM | POA: Diagnosis not present

## 2023-03-23 DIAGNOSIS — Z0000 Encounter for general adult medical examination without abnormal findings: Secondary | ICD-10-CM | POA: Diagnosis not present

## 2023-03-23 DIAGNOSIS — J309 Allergic rhinitis, unspecified: Secondary | ICD-10-CM | POA: Diagnosis not present

## 2023-03-23 DIAGNOSIS — K219 Gastro-esophageal reflux disease without esophagitis: Secondary | ICD-10-CM | POA: Diagnosis not present

## 2023-03-23 DIAGNOSIS — Z131 Encounter for screening for diabetes mellitus: Secondary | ICD-10-CM | POA: Diagnosis not present

## 2023-03-23 DIAGNOSIS — E7800 Pure hypercholesterolemia, unspecified: Secondary | ICD-10-CM | POA: Diagnosis not present

## 2023-03-23 DIAGNOSIS — K5904 Chronic idiopathic constipation: Secondary | ICD-10-CM | POA: Diagnosis not present

## 2023-04-13 DIAGNOSIS — D485 Neoplasm of uncertain behavior of skin: Secondary | ICD-10-CM | POA: Diagnosis not present

## 2023-04-13 DIAGNOSIS — D2272 Melanocytic nevi of left lower limb, including hip: Secondary | ICD-10-CM | POA: Diagnosis not present

## 2023-04-24 DIAGNOSIS — L0889 Other specified local infections of the skin and subcutaneous tissue: Secondary | ICD-10-CM | POA: Diagnosis not present

## 2024-01-09 DIAGNOSIS — N951 Menopausal and female climacteric states: Secondary | ICD-10-CM | POA: Diagnosis not present

## 2024-02-13 DIAGNOSIS — D2239 Melanocytic nevi of other parts of face: Secondary | ICD-10-CM | POA: Diagnosis not present

## 2024-02-13 DIAGNOSIS — D2262 Melanocytic nevi of left upper limb, including shoulder: Secondary | ICD-10-CM | POA: Diagnosis not present

## 2024-02-13 DIAGNOSIS — L565 Disseminated superficial actinic porokeratosis (DSAP): Secondary | ICD-10-CM | POA: Diagnosis not present

## 2024-02-13 DIAGNOSIS — D2261 Melanocytic nevi of right upper limb, including shoulder: Secondary | ICD-10-CM | POA: Diagnosis not present
